# Patient Record
Sex: Female | Born: 1965 | Race: White | Hispanic: No | Marital: Single | Smoking: Former smoker
Health system: Southern US, Community
[De-identification: ages and names within clinical notes are randomized; demographics above are authoritative.]

## PROBLEM LIST (undated history)

## (undated) DIAGNOSIS — N912 Amenorrhea, unspecified: Secondary | ICD-10-CM

## (undated) DIAGNOSIS — H348112 Central retinal vein occlusion, right eye, stable: Secondary | ICD-10-CM

## (undated) DIAGNOSIS — F419 Anxiety disorder, unspecified: Secondary | ICD-10-CM

## (undated) DIAGNOSIS — E669 Obesity, unspecified: Secondary | ICD-10-CM

## (undated) DIAGNOSIS — E119 Type 2 diabetes mellitus without complications: Secondary | ICD-10-CM

## (undated) DIAGNOSIS — D329 Benign neoplasm of meninges, unspecified: Secondary | ICD-10-CM

## (undated) DIAGNOSIS — M5412 Radiculopathy, cervical region: Secondary | ICD-10-CM

## (undated) DIAGNOSIS — R569 Unspecified convulsions: Secondary | ICD-10-CM

## (undated) DIAGNOSIS — G4733 Obstructive sleep apnea (adult) (pediatric): Secondary | ICD-10-CM

## (undated) DIAGNOSIS — I10 Essential (primary) hypertension: Secondary | ICD-10-CM

## (undated) DIAGNOSIS — G4730 Sleep apnea, unspecified: Secondary | ICD-10-CM

## (undated) DIAGNOSIS — K219 Gastro-esophageal reflux disease without esophagitis: Secondary | ICD-10-CM

## (undated) DIAGNOSIS — Z87891 Personal history of nicotine dependence: Secondary | ICD-10-CM

## (undated) DIAGNOSIS — F32A Depression, unspecified: Secondary | ICD-10-CM

## (undated) DIAGNOSIS — R7303 Prediabetes: Secondary | ICD-10-CM

## (undated) HISTORY — DX: Depression, unspecified: F32.A

## (undated) HISTORY — DX: Unspecified convulsions: R56.9

## (undated) HISTORY — DX: Anxiety disorder, unspecified: F41.9

## (undated) HISTORY — DX: Gastro-esophageal reflux disease without esophagitis: K21.9

## (undated) HISTORY — DX: Sleep apnea, unspecified: G47.30

## (undated) HISTORY — DX: Benign neoplasm of meninges, unspecified: D32.9

## (undated) HISTORY — DX: Central retinal vein occlusion, right eye, stable: H34.8112

## (undated) HISTORY — DX: Obesity, unspecified: E66.9

## (undated) HISTORY — DX: Amenorrhea, unspecified: N91.2

## (undated) HISTORY — DX: Essential (primary) hypertension: I10

## (undated) HISTORY — DX: Prediabetes: R73.03

---

## 2004-07-25 ENCOUNTER — Ambulatory Visit: Payer: Self-pay | Admitting: Family Medicine

## 2004-09-04 ENCOUNTER — Ambulatory Visit: Payer: Self-pay | Admitting: Surgery

## 2005-12-13 ENCOUNTER — Ambulatory Visit: Payer: Self-pay | Admitting: Obstetrics and Gynecology

## 2005-12-31 ENCOUNTER — Ambulatory Visit: Payer: Self-pay | Admitting: Obstetrics and Gynecology

## 2006-08-14 ENCOUNTER — Ambulatory Visit: Payer: Self-pay | Admitting: Obstetrics and Gynecology

## 2007-01-06 ENCOUNTER — Ambulatory Visit: Payer: Self-pay | Admitting: Obstetrics and Gynecology

## 2008-01-08 ENCOUNTER — Ambulatory Visit: Payer: Self-pay | Admitting: Obstetrics and Gynecology

## 2009-01-11 ENCOUNTER — Ambulatory Visit: Payer: Self-pay | Admitting: Obstetrics and Gynecology

## 2010-01-17 ENCOUNTER — Ambulatory Visit: Payer: Self-pay | Admitting: Obstetrics and Gynecology

## 2011-01-23 ENCOUNTER — Ambulatory Visit: Payer: Self-pay | Admitting: Obstetrics and Gynecology

## 2012-04-17 ENCOUNTER — Ambulatory Visit: Payer: Self-pay | Admitting: Obstetrics and Gynecology

## 2013-04-21 ENCOUNTER — Ambulatory Visit: Payer: Self-pay | Admitting: Obstetrics and Gynecology

## 2014-01-15 DIAGNOSIS — E669 Obesity, unspecified: Secondary | ICD-10-CM | POA: Insufficient documentation

## 2014-01-15 DIAGNOSIS — I10 Essential (primary) hypertension: Secondary | ICD-10-CM | POA: Insufficient documentation

## 2014-04-26 ENCOUNTER — Ambulatory Visit: Payer: Self-pay | Admitting: Obstetrics and Gynecology

## 2015-02-28 ENCOUNTER — Other Ambulatory Visit: Payer: Self-pay | Admitting: Obstetrics and Gynecology

## 2015-02-28 DIAGNOSIS — Z1231 Encounter for screening mammogram for malignant neoplasm of breast: Secondary | ICD-10-CM

## 2015-04-28 ENCOUNTER — Ambulatory Visit
Admission: RE | Admit: 2015-04-28 | Discharge: 2015-04-28 | Disposition: A | Discharge status: Home / Self Care | Payer: BC Managed Care – PPO | Source: Ambulatory Visit | Attending: Obstetrics and Gynecology | Admitting: Obstetrics and Gynecology

## 2015-04-28 DIAGNOSIS — Z1231 Encounter for screening mammogram for malignant neoplasm of breast: Secondary | ICD-10-CM | POA: Diagnosis not present

## 2016-04-06 DIAGNOSIS — R7303 Prediabetes: Secondary | ICD-10-CM | POA: Insufficient documentation

## 2016-05-03 ENCOUNTER — Encounter: Payer: Self-pay | Admitting: Dietician

## 2016-05-03 ENCOUNTER — Encounter: Payer: BC Managed Care – PPO | Attending: Physician Assistant | Admitting: Dietician

## 2016-05-03 VITALS — Ht 66.0 in | Wt 274.8 lb

## 2016-05-03 DIAGNOSIS — R7303 Prediabetes: Secondary | ICD-10-CM | POA: Diagnosis not present

## 2016-05-03 NOTE — Progress Notes (Signed)
Medical Nutrition Therapy: Visit start time: [id number]  end time: 1715 Assessment:  Diagnosis: pre-diabetes (HgA1c of 5.8) Past medical history: hypertension, depression, obesity Psychosocial issues/ stress concerns: Patient rates her stress as moderate and indicates "ok" as to how well she is dealing with her stress.  Preferred learning method:  . Auditory . Visual  Current weight: 274.8 lbs  Height: 66 in Medications, supplements: see list Progress and evaluation:  Patient in for initial medical nutrition therapy assessment. She report she participated in an exercise "boot camp" a year ago and lost 8-10 lbs. She reported that the "diet" was too restrictive and the exercise too strenuous to continue. She regained the weight and her weight has remained stable in the past 9-10 months. She eats 3 meals per day and usually 2 snacks. Her lunch meals and dinner meals are frequently "take out" or dine "out" meals making it harder to control portions. She states that recently she has tried to be more mindful of the foods she selects.  Her main beverages are water and black coffee; no sweetened beverages. She likes vegetables but doesn't usually prepare at home because her 32 year old son doesn't eat them.  Her present diet is low in fruits, vegetables, fiber, whole grains and calcium sources. Physical activity: none  Dietary Intake:  Usual eating pattern includes 3 meals and 2 snacks per day. Dining out frequency: 11 meals per week.  Breakfast: oatmeal or cereal bar or 2 waffles with peanut butter or peanut butter toast with lite syrup. Snack: nabs or nuts or candy -whatever available. Lunch: 11:30 or 1:30pm- Has to be at "front desk" from 12-1:00pm Ex. Smoked [place] sub on sour dough bread, chips, water Snack: same as am Supper: 5:30to 7:00pm- [place] salad with grilled chicken or when cooks-Ex. Chicken and pasta Snack: usually no snack Beverages: water 6-7 cups per day, 3 cups of black  coffee  Nutrition Care Education: Pre-Diabetes: Discussed how positive diet and exercise changes can decrease risk of Type 2 diabetes.Instructed on a meal plan for pre-diabetes and to promote weight loss based on 1600 calories. Included carbohydrate counting and using food guide plate to instruct on balance of carbohydrate, protein and non-starchy vegetables. Discussed using the meal plan just as a guide to help her be more mindful of her food choices verses focusing on restriction. Encouraged to focus on foods that need to be added to her diet to meet basic nutritional needs. Gave and reviewed menu examples.  Nutritional Diagnosis:  [place]-3.3 Overweight/obesity As related to frequent dining out and lack of physical activity.  As evidenced by diet and exercise history..  Intervention:  Balance meals with lean protein, 2-4 servings of carbohydrate and "free vegetables". Keep frozen vegetables for a quick way to add to dinner meals. Plan meals- especially lunch and dinner. Take lunch more often to work. Limit added fats such as salad dressing, mayonnaise and margarines. Mix 1 flavored package of oatmeal with regular oatmeal or add light syrup to oatmeal. Pick up entrees for some evening meals and add fruit, salads or steamed vegetables as sides Education Materials given:  . Plate Planner . Food lists/ Planning A Balanced Meal . Sample meal pattern/ menus . Goals/ instructions Learner/ who was taught:  . Patient  Level of understanding: . Partial understanding; needs review/ practice VLearning barriers: . None Willingness to learn/ readiness for change: . Acceptance, ready for change  Monitoring and Evaluation:  Dietary intake, exercise, , and body weight  follow up: 05/29/16 at 9:00am

## 2016-05-03 NOTE — Patient Instructions (Addendum)
Balance meals with lean protein, 2-4 servings of carbohydrate and "free vegetables". Keep frozen vegetables for a quick way to add to dinner meals. Plan meals- especially lunch and dinner. Take lunch more often to work. Limit added fats such as salad dressing, mayonnaise and margarines. Mix 1 flavored package of oatmeal with regular oatmeal or add light syrup to oatmeal. Pick up entrees for some evening meals and add fruit, salads or steamed vegetables as sides.

## 2016-05-29 ENCOUNTER — Encounter: Payer: BC Managed Care – PPO | Attending: Physician Assistant | Admitting: Dietician

## 2016-05-29 VITALS — Ht 66.0 in | Wt 272.7 lb

## 2016-05-29 DIAGNOSIS — R7303 Prediabetes: Secondary | ICD-10-CM

## 2016-05-29 NOTE — Progress Notes (Signed)
Medical Nutrition Therapy Follow-up visit:  Time with patient:9:10-10:00am Visit #:2 ASSESSMENT:   Diagnosis:pre-diabetes (HgA1c of 5.8 )  Current weight:272.7     Height:66 in Medications: See list Progress and evaluation: Patient in for medical nutrition follow-up. She reports that she is planning meals more, especially her evening meals. She states she is reading labels for carbohydrate and has decreased in her diet. She states she has not decreased frequency of lunch meals eaten out, but is making lower fat choices.  She states she has not significantly increased fruits and vegetables in her diet but feels more ready to do so now. She has increased her intake of whole grains with oatmeal and ww bread but continues to have a low intake of fiber. She limits her alcohol intake to 1 day/week  when socializing with friends but drinks 6-7 servings on that day. She has lost 2.5 lbs since initial visit, 1 month ago.  She is not doing any type of physical activity. She does report that some of her stress has decreased since her friend is going to rent the house her mother lived in (next to patient's house) so plans to sell it have been put on hold.   NUTRITION CARE EDUCATION: Pre-diabetes:  Commended patient on positive diet changes she has made. Reviewed meal plan instructed on at previous visit as patient had questions regarding servings of carbohydrate vs. grams of carbohydrate. Stressed importance again of need for balance of adequate carbohydrate, protein, fat and non-starchy vegetables. Reviewed protein needs as patient may include 1-2 Premier shakes per day which adds 30-60 gms to her diet. Encouraged to decrease alcohol servings. Stressed again the role of exercise in controlling blood sugar as well as role in controlling weight. Patient states she is not ready to commit to structured exercise at this time.  INTERVENTION:  Include 2-4 servings of carbohydrate or 30 -60 gms of  carbohydrate per meal. Spread 10-11 servings of carbohydrate over 3 meals and 1-2 snacks. Continue to make lower fat choices when eating "out". Purchase more fruits and vegetables so can add to meals. Count 1 cup of any kind of casserole as 2 servings of carbohydrate. Protein- 60-65 gms/ day will meet basic protein needs.   EDUCATION MATERIALS GIVEN:  . Goals/ instructions LEARNER/ who was taught:  . Patient  LEVEL OF UNDERSTANDING: . Partial understanding; needs review/ practice LEARNING BARRIERS: . None WILLINGNESS TO LEARN/READINESS FOR CHANGE: . Eager, change in progress MONITORING AND EVALUATION:  Weight, diet, exercise  Follow-up 06/26/16 at 9:00am.

## 2016-05-29 NOTE — Patient Instructions (Addendum)
Include 2-4 servings of carbohydrate or 30 -60 gms of carbohydrate per meal. Spread 10-11 servings of carbohydrate over 3 meals and 1-2 snacks. Continue to make lower fat choices when eating "out". Purchase more fruits and vegetables so can add to meals. Count 1 cup of any kind of casserole as 2 servings of carbohydrate. Protein- 60-65 gms/ day will meet basic protein needs.

## 2016-06-26 ENCOUNTER — Encounter: Payer: Self-pay | Admitting: Dietician

## 2016-06-26 ENCOUNTER — Encounter: Payer: BC Managed Care – PPO | Attending: Physician Assistant | Admitting: Dietician

## 2016-06-26 VITALS — Ht 66.0 in | Wt 274.5 lb

## 2016-06-26 DIAGNOSIS — R7303 Prediabetes: Secondary | ICD-10-CM

## 2016-06-26 NOTE — Progress Notes (Signed)
Medical Nutrition Therapy Follow-up visit:  Time with patient: 9:05-9:45 Visit #:3 ASSESSMENT:  Diagnosis:pre-diabetes  Current weight:274.5 lbs    Height:66 in Medications: See list  Progress and evaluation: Patient in for medical nutrition therapy follow-up. She states that making positive diet choices has been "hit or mss" lately. She is not consistently planning her evening meals as she had previously done with success. She continues to be more aware of her food choices; for example she made soup this past weekend as a way to eat more vegetables. She also bought strawberries this week. She continues to eat most of her lunch meals "out" but tries to make lower fat choices on some days. She has tried to limit alcohol intake; for example had 1 mixed drink when out with a friend verses several. She has gained 1.8 lbs in the past month and was not surprised. She stated that this past weekend she baked cookies and ate 7-8 on Friday and Saturday. She has had no net gain in past 2 months since she initially lost 2.5 lbs after initial visit. She stated regarding her diet/nutrition, "I am doing all I can do right now".  She states she continues to feel overwhelmed especially regarding the need to sell her mother's house. The plan for a friend to move in has been delayed until Jan. 2018 which was a "set back" and discouraging for her.  Physical activity:no structured exercise; has a membership with [employer] but states she does not like to go to a gym.  NUTRITION CARE EDUCATION: Reviewed with patient the positive diet changes she has made and the fact that she is being more aware of her food choices. Discussed how over a 2 month period, her weight has been stable and questioned if more consistent exercise is a possibility and discussed options other than the gym. Again, encouraged her to continue to work on increasing foods that she likes that will help to meet basic nutrient needs and on  keeping a consistent meal pattern.  Offered for her to meet with [facility] counselor but she declined.   INTERVENTION:  Continue with mindful eating looking for ways to increase fruits, vegetables and calcium sources.Discussed options. Consider planning out evening meals again as this seemed to help without overwhelming. Walk on walking track near her home at least 2 days per week. Continue to limit alcohol.  EDUCATION MATERIALS GIVEN:  . Goals/ instructions  LEARNER/ who was taught:  . Patient   LEVEL OF UNDERSTANDING: Verbalizes understanding.  LEARNING BARRIERS: . None  WILLINGNESS TO LEARN/READINESS FOR CHANGE: . Hesitance, but making some changes.  MONITORING AND EVALUATION:  No follow-up scheduled. Patient was encouraged to call if desires further help with diet/nutrition.

## 2016-10-03 ENCOUNTER — Other Ambulatory Visit: Payer: Self-pay | Admitting: Physician Assistant

## 2016-10-03 DIAGNOSIS — Z1231 Encounter for screening mammogram for malignant neoplasm of breast: Secondary | ICD-10-CM

## 2016-10-31 ENCOUNTER — Ambulatory Visit
Admission: RE | Admit: 2016-10-31 | Discharge: 2016-10-31 | Disposition: A | Discharge status: Home / Self Care | Payer: BC Managed Care – PPO | Source: Ambulatory Visit | Attending: Physician Assistant | Admitting: Physician Assistant

## 2016-10-31 DIAGNOSIS — Z1231 Encounter for screening mammogram for malignant neoplasm of breast: Secondary | ICD-10-CM | POA: Insufficient documentation

## 2018-06-27 ENCOUNTER — Other Ambulatory Visit: Payer: Self-pay | Admitting: Physician Assistant

## 2018-06-27 ENCOUNTER — Other Ambulatory Visit: Payer: Self-pay | Admitting: Obstetrics and Gynecology

## 2018-06-27 DIAGNOSIS — Z1231 Encounter for screening mammogram for malignant neoplasm of breast: Secondary | ICD-10-CM

## 2018-07-17 ENCOUNTER — Ambulatory Visit
Admission: RE | Admit: 2018-07-17 | Discharge: 2018-07-17 | Disposition: A | Discharge status: Home / Self Care | Payer: BC Managed Care – PPO | Source: Ambulatory Visit | Attending: Physician Assistant | Admitting: Physician Assistant

## 2018-07-17 DIAGNOSIS — Z1231 Encounter for screening mammogram for malignant neoplasm of breast: Secondary | ICD-10-CM

## 2019-11-02 ENCOUNTER — Other Ambulatory Visit: Payer: Self-pay | Admitting: Physician Assistant

## 2019-11-02 DIAGNOSIS — Z1231 Encounter for screening mammogram for malignant neoplasm of breast: Secondary | ICD-10-CM

## 2019-12-21 ENCOUNTER — Other Ambulatory Visit: Payer: Self-pay

## 2019-12-21 ENCOUNTER — Ambulatory Visit: Payer: Self-pay

## 2019-12-21 DIAGNOSIS — L918 Other hypertrophic disorders of the skin: Secondary | ICD-10-CM

## 2019-12-21 DIAGNOSIS — L820 Inflamed seborrheic keratosis: Secondary | ICD-10-CM

## 2019-12-21 DIAGNOSIS — L814 Other melanin hyperpigmentation: Secondary | ICD-10-CM

## 2019-12-21 NOTE — Progress Notes (Signed)
Pt has no interest in treating her lentigo's or Sk's for cosmetic reasons. She will F/U with Dr. Nehemiah Massed for [phone] and skin tags. jj

## 2020-01-28 ENCOUNTER — Ambulatory Visit: Payer: BC Managed Care – PPO | Admitting: Dermatology

## 2020-11-09 ENCOUNTER — Other Ambulatory Visit: Payer: Self-pay | Admitting: Physician Assistant

## 2020-11-09 DIAGNOSIS — Z1231 Encounter for screening mammogram for malignant neoplasm of breast: Secondary | ICD-10-CM

## 2020-12-01 ENCOUNTER — Ambulatory Visit
Admission: RE | Admit: 2020-12-01 | Discharge: 2020-12-01 | Disposition: A | Discharge status: Home / Self Care | Payer: BC Managed Care – PPO | Source: Ambulatory Visit | Attending: Physician Assistant | Admitting: Physician Assistant

## 2020-12-01 ENCOUNTER — Other Ambulatory Visit: Payer: Self-pay

## 2020-12-01 DIAGNOSIS — Z1231 Encounter for screening mammogram for malignant neoplasm of breast: Secondary | ICD-10-CM | POA: Insufficient documentation

## 2021-12-19 ENCOUNTER — Other Ambulatory Visit: Payer: Self-pay | Admitting: Physician Assistant

## 2021-12-19 DIAGNOSIS — Z1231 Encounter for screening mammogram for malignant neoplasm of breast: Secondary | ICD-10-CM

## 2022-01-24 ENCOUNTER — Ambulatory Visit
Admission: RE | Admit: 2022-01-24 | Discharge: 2022-01-24 | Disposition: A | Discharge status: Home / Self Care | Payer: BC Managed Care – PPO | Source: Ambulatory Visit | Attending: Physician Assistant | Admitting: Physician Assistant

## 2022-01-24 DIAGNOSIS — Z1231 Encounter for screening mammogram for malignant neoplasm of breast: Secondary | ICD-10-CM | POA: Insufficient documentation

## 2022-02-01 ENCOUNTER — Other Ambulatory Visit: Payer: Self-pay | Admitting: Physician Assistant

## 2022-02-01 DIAGNOSIS — R928 Other abnormal and inconclusive findings on diagnostic imaging of breast: Secondary | ICD-10-CM

## 2022-02-01 DIAGNOSIS — N63 Unspecified lump in unspecified breast: Secondary | ICD-10-CM

## 2022-02-08 ENCOUNTER — Other Ambulatory Visit: Payer: BC Managed Care – PPO

## 2022-02-08 ENCOUNTER — Inpatient Hospital Stay: Admission: RE | Admit: 2022-02-08 | Payer: BC Managed Care – PPO | Source: Ambulatory Visit

## 2022-02-09 ENCOUNTER — Emergency Department: Payer: BC Managed Care – PPO

## 2022-02-09 ENCOUNTER — Other Ambulatory Visit: Payer: Self-pay

## 2022-02-09 ENCOUNTER — Emergency Department
Admission: EM | Admit: 2022-02-09 | Discharge: 2022-02-09 | Disposition: A | Discharge status: Nursing Home (Includes ICF) | Payer: BC Managed Care – PPO | Attending: Emergency Medicine | Admitting: Emergency Medicine

## 2022-02-09 DIAGNOSIS — R569 Unspecified convulsions: Secondary | ICD-10-CM | POA: Diagnosis present

## 2022-02-09 DIAGNOSIS — W06XXXA Fall from bed, initial encounter: Secondary | ICD-10-CM | POA: Diagnosis not present

## 2022-02-09 DIAGNOSIS — C719 Malignant neoplasm of brain, unspecified: Secondary | ICD-10-CM | POA: Diagnosis not present

## 2022-02-09 DIAGNOSIS — M6281 Muscle weakness (generalized): Secondary | ICD-10-CM | POA: Diagnosis not present

## 2022-02-09 DIAGNOSIS — R401 Stupor: Secondary | ICD-10-CM

## 2022-02-09 DIAGNOSIS — G40901 Epilepsy, unspecified, not intractable, with status epilepticus: Secondary | ICD-10-CM

## 2022-02-09 DIAGNOSIS — D496 Neoplasm of unspecified behavior of brain: Secondary | ICD-10-CM

## 2022-02-09 LAB — CBC WITH DIFFERENTIAL/PLATELET
Abs Immature Granulocytes: 0.08 10*3/uL — ABNORMAL HIGH (ref 0.00–0.07)
Basophils Absolute: 0 10*3/uL (ref 0.0–0.1)
Basophils Relative: 0 %
Eosinophils Absolute: 0 10*3/uL (ref 0.0–0.5)
Eosinophils Relative: 0 %
HCT: 41.2 % (ref 36.0–46.0)
Hemoglobin: 13.5 g/dL (ref 12.0–15.0)
Immature Granulocytes: 1 %
Lymphocytes Relative: 12 %
Lymphs Abs: 2.1 10*3/uL (ref 0.7–4.0)
MCH: 28.4 pg (ref 26.0–34.0)
MCHC: 32.8 g/dL (ref 30.0–36.0)
MCV: 86.6 fL (ref 80.0–100.0)
Monocytes Absolute: 1.3 10*3/uL — ABNORMAL HIGH (ref 0.1–1.0)
Monocytes Relative: 8 %
Neutro Abs: 14.1 10*3/uL — ABNORMAL HIGH (ref 1.7–7.7)
Neutrophils Relative %: 79 %
Platelets: 271 10*3/uL (ref 150–400)
RBC: 4.76 MIL/uL (ref 3.87–5.11)
RDW: 12.8 % (ref 11.5–15.5)
WBC: 17.6 10*3/uL — ABNORMAL HIGH (ref 4.0–10.5)
nRBC: 0 % (ref 0.0–0.2)

## 2022-02-09 LAB — PROTIME-INR
INR: 1.1 (ref 0.8–1.2)
Prothrombin Time: 14.5 seconds (ref 11.4–15.2)

## 2022-02-09 LAB — COMPREHENSIVE METABOLIC PANEL
ALT: 32 U/L (ref 0–44)
AST: 66 U/L — ABNORMAL HIGH (ref 15–41)
Albumin: 3.7 g/dL (ref 3.5–5.0)
Alkaline Phosphatase: 67 U/L (ref 38–126)
Anion gap: 13 (ref 5–15)
BUN: 19 mg/dL (ref 6–20)
CO2: 20 mmol/L — ABNORMAL LOW (ref 22–32)
Calcium: 9.1 mg/dL (ref 8.9–10.3)
Chloride: 106 mmol/L (ref 98–111)
Creatinine, Ser: 0.85 mg/dL (ref 0.44–1.00)
GFR, Estimated: >60 mL/min (ref >60)
Glucose, Bld: 182 mg/dL — ABNORMAL HIGH (ref 70–99)
Potassium: 3.8 mmol/L (ref 3.5–5.1)
Sodium: 139 mmol/L (ref 135–145)
Total Bilirubin: 1 mg/dL (ref 0.3–1.2)
Total Protein: 7.3 g/dL (ref 6.5–8.1)

## 2022-02-09 LAB — URINE DRUG SCREEN, QUALITATIVE (ARMC ONLY)
Amphetamines, Ur Screen: NOT DETECTED
Barbiturates, Ur Screen: NOT DETECTED
Benzodiazepine, Ur Scrn: POSITIVE — AB
Cannabinoid 50 Ng, Ur ~~LOC~~: NOT DETECTED
Cocaine Metabolite,Ur ~~LOC~~: NOT DETECTED
MDMA (Ecstasy)Ur Screen: NOT DETECTED
Methadone Scn, Ur: NOT DETECTED
Opiate, Ur Screen: NOT DETECTED
Phencyclidine (PCP) Ur S: NOT DETECTED
Tricyclic, Ur Screen: NOT DETECTED

## 2022-02-09 LAB — URINALYSIS, ROUTINE W REFLEX MICROSCOPIC
Bilirubin Urine: NEGATIVE
Glucose, UA: NEGATIVE mg/dL
Ketones, ur: NEGATIVE mg/dL
Leukocytes,Ua: NEGATIVE
Nitrite: NEGATIVE
Protein, ur: 100 mg/dL — AB
Specific Gravity, Urine: 1.027 (ref 1.005–1.030)
pH: 6 (ref 5.0–8.0)

## 2022-02-09 LAB — ETHANOL: Alcohol, Ethyl (B): <10 mg/dL (ref <10)

## 2022-02-09 LAB — ACETAMINOPHEN LEVEL: Acetaminophen (Tylenol), Serum: <10 ug/mL — ABNORMAL LOW (ref 10–30)

## 2022-02-09 LAB — SALICYLATE LEVEL: Salicylate Lvl: <7 mg/dL — ABNORMAL LOW (ref 7.0–30.0)

## 2022-02-09 MED ORDER — SODIUM CHLORIDE 0.9 % IV BOLUS
1000.0000 mL | Freq: Once | INTRAVENOUS | Status: AC
  Administered 2022-02-09: 1000 mL via INTRAVENOUS

## 2022-02-09 MED ORDER — LORAZEPAM 2 MG/ML IJ SOLN: 2.0000 mg | Freq: Once | INTRAMUSCULAR | Status: AC

## 2022-02-09 MED ORDER — LEVETIRACETAM IN NACL 1000 MG/100ML IV SOLN
1000.0000 mg | Freq: Once | INTRAVENOUS | Status: AC
  Administered 2022-02-09: 1000 mg via INTRAVENOUS
  Filled 2022-02-09: qty 100

## 2022-02-09 MED ORDER — GADOBUTROL 1 MMOL/ML IV SOLN: 10.0000 mL | Freq: Once | INTRAVENOUS | Status: DC | PRN

## 2022-02-09 MED ORDER — METHYLPREDNISOLONE SODIUM SUCC 125 MG IJ SOLR
125.0000 mg | INTRAMUSCULAR | Status: AC
  Administered 2022-02-09: 125 mg via INTRAVENOUS
  Filled 2022-02-09: qty 2

## 2022-02-09 MED ORDER — LORAZEPAM 2 MG/ML IJ SOLN
INTRAMUSCULAR | Status: AC
  Filled 2022-02-09: qty 1

## 2022-02-09 MED ORDER — LEVETIRACETAM IN NACL 1000 MG/100ML IV SOLN
1000.0000 mg | Freq: Once | INTRAVENOUS | Status: AC
  Administered 2022-02-09: 1000 mg via INTRAVENOUS
  Filled 2022-02-09: qty 200

## 2022-02-09 MED ORDER — SODIUM CHLORIDE 0.9 % IV SOLN: 2000.0000 mg | INTRAVENOUS | Status: DC

## 2022-02-09 MED ORDER — LORAZEPAM 2 MG/ML IJ SOLN
INTRAMUSCULAR | Status: AC
  Administered 2022-02-09: 2 mg via INTRAVENOUS
  Filled 2022-02-09: qty 1

## 2022-02-09 NOTE — ED Triage Notes (Signed)
BIB EMS from home. Called earlier for a fall. pt refused first EMS trasnport. Pt has no HX of seizures. EMS witnessed seizured. gave 2 mg of IN versed.  Pt oxygen dropped to 93% after versed. EMS placed on [place] at 2 liters. Alert to painful sstimuli. Pt in [place] for EMS.   22G IV R Hand 98 Axillary 211 BGL  89 HR  24 RR  119/74

## 2022-02-09 NOTE — ED Notes (Signed)
Pt transported to MRI on monitor with this RN.

## 2022-02-09 NOTE — ED Notes (Signed)
Called care link to request transport assistance to [facility] ED @ this time short staffed and extremely backed up with transports per [place] @ 1444

## 2022-02-09 NOTE — Procedures (Signed)
History: 56 year old female presenting with recurrent seizures with persistent left-sided weakness  Sedation: Ativan prior to study  Technique: This EEG was acquired with electrodes placed according to the International 10-20 electrode system (including Fp1, Fp2, F3, F4, C3, C4, P3, P4, O1, O2, T3, T4, T5, T6, A1, A2, Fz, Cz, Pz). The following electrodes were missing or displaced: none.   Background: There is an asymmetric background with attenuation of faster frequencies on the right compared to the left.  There is irregular slow activity throughout the right hemisphere.  There is a posterior dominant rhythm of 8 Hz that is seen on the left, but not on the right.  In addition, there are occasional right temporal sharp waves.   Photic stimulation: Physiologic driving is not performed  EEG Abnormalities: 1) right temporal sharp waves 2) right hemispheric slow activity 3) attenuation of posterior dominant rhythm on the right  Clinical Interpretation: This EEG demonstrates evidence of cerebral dysfunction of the right hemisphere as well as potential area of epileptogenicity in the right temporal region. There was no seizure recorded on this study.   Roland Rack, MD [facility] [phone]  If 7pm- 7am, please page neurology on call as listed in [place].

## 2022-02-09 NOTE — ED Notes (Signed)
All required documents reviewed by this Charge Rn are up to date and pt is ready for transport.

## 2022-02-09 NOTE — ED Notes (Signed)
Pt had witnessed seizure. Pt started twitched in the left side of the face. Full body shaking and urinated on herself. MD made aware. Neuro at bedside as well. Pt will go to CT.

## 2022-02-09 NOTE — Progress Notes (Signed)
Eeg done 

## 2022-02-09 NOTE — ED Notes (Signed)
[facility] transportation, [facility] contacted to set up transport for patient. Report called to Martinique Keck, Agricultural consultant at [facility] ED.

## 2022-02-09 NOTE — ED Notes (Signed)
Keppra out of stock in [place]. Message sent to pharmacy.

## 2022-02-09 NOTE — ED Provider Notes (Signed)
[facility] Provider Note    Event Date/Time   First MD Initiated Contact with Patient 02/09/22 1130     (approximate)   History   Chief Complaint: Seizures   HPI  Stephanie Francis is a 56 y.o. female with no known past medical history who comes ED by EMS due to seizures.  EMS reports the patient was called out to her home at 9:45 AM this morning due to falling out of bed.  At the time the patient refused EMS transport, but about an hour later at 1045 EMS was called to the home again with a reported seizure.  They report that the patient had a seizure that they witnessed as well which was generalized convulsive activity, lasting about 15 seconds, aborted by giving intranasal Versed.  Patient is not able to provide any further history at this time     Physical Exam   Triage Vital Signs: ED Triage Vitals  Enc Vitals Group     BP 02/09/22 1122 127/63     Pulse Rate 02/09/22 1122 90     Resp 02/09/22 1122 (!) 28     Temp --      Temp src --      SpO2 02/09/22 1122 98 %     Weight 02/09/22 1121 274 lb 7.6 oz (124.5 kg)     Height 02/09/22 1121 '5\' 6"'$  (1.676 m)     Head Circumference --      Peak Flow --      Pain Score 02/09/22 1120 0     Pain Loc --      Pain Edu? --      Excl. in [place]? --     Most recent vital signs: Vitals:   02/09/22 1300 02/09/22 1302  BP: (!) 99/59 102/66  Pulse: 87 87  Resp: (!) 26 (!) 22  SpO2: 97% 96%    General: Awake, not oriented CV:  Good peripheral perfusion.  Regular rate and rhythm Resp:  Normal effort.  Clear to auscultation bilaterally Abd:  No distention.  Soft and nontender Other:  PERRL, EOMI, cranial nerves III through XII intact.  Follows commands.  Exhibits weakness of the left upper extremity, moves all other extremities and withdraws to pain   ED Results / Procedures / Treatments   Labs (all labs ordered are listed, but only abnormal results are displayed) Labs Reviewed  COMPREHENSIVE METABOLIC  PANEL - Abnormal; Notable for the following components:      Result Value   CO2 20 (*)    Glucose, Bld 182 (*)    AST 66 (*)    All other components within normal limits  SALICYLATE LEVEL - Abnormal; Notable for the following components:   Salicylate Lvl <7.6 (*)    All other components within normal limits  ACETAMINOPHEN LEVEL - Abnormal; Notable for the following components:   Acetaminophen (Tylenol), Serum <10 (*)    All other components within normal limits  CBC WITH DIFFERENTIAL/PLATELET - Abnormal; Notable for the following components:   WBC 17.6 (*)    Neutro Abs 14.1 (*)    Monocytes Absolute 1.3 (*)    Abs Immature Granulocytes 0.08 (*)    All other components within normal limits  ETHANOL  PROTIME-INR  URINALYSIS, ROUTINE W REFLEX MICROSCOPIC  URINE DRUG SCREEN, QUALITATIVE ([facility] ONLY)  CBG MONITORING, ED     EKG    RADIOLOGY CT head viewed and interpreted by me, shows sizable mass in the right  frontal cortex.  Radiology report reviewed   PROCEDURES:  .Critical Care Performed by: Carrie Mew, MD Authorized by: Carrie Mew, MD   Critical care provider statement:    Critical care time (minutes):  35   Critical care time was exclusive of:  Separately billable procedures and treating other patients   Critical care was necessary to treat or prevent imminent or life-threatening deterioration of the following conditions:  CNS failure or compromise   Critical care was time spent personally by me on the following activities:  Development of treatment plan with patient or surrogate, discussions with consultants, evaluation of patient's response to treatment, examination of patient, obtaining history from patient or surrogate, ordering and performing treatments and interventions, ordering and review of laboratory studies, ordering and review of radiographic studies, pulse oximetry, re-evaluation of patient's condition and review of old charts Comments:         .1-3 Lead EKG Interpretation Performed by: Carrie Mew, MD Authorized by: Carrie Mew, MD     Interpretation: normal     ECG rate:  70   ECG rate assessment: normal     Rhythm: sinus rhythm     Ectopy: none     Conduction: normal     MEDICATIONS ORDERED IN ED: Medications  gadobutrol (GADAVIST) 1 MMOL/ML injection 10 mL (has no administration in time range)  LORazepam (ATIVAN) 2 MG/ML injection (  Given 02/09/22 1130)  sodium chloride 0.9 % bolus 1,000 mL (0 mLs Intravenous Stopped 02/09/22 1403)  methylPREDNISolone sodium succinate (SOLU-MEDROL) 125 mg/2 mL injection 125 mg (125 mg Intravenous Given 02/09/22 1205)  levETIRAcetam (KEPPRA) IVPB 1000 mg/100 mL premix (0 mg Intravenous Stopped 02/09/22 1221)    Followed by  levETIRAcetam (KEPPRA) IVPB 1000 mg/100 mL premix (0 mg Intravenous Stopped 02/09/22 1240)  LORazepam (ATIVAN) injection 2 mg (2 mg Intravenous Given 02/09/22 1357)  levETIRAcetam (KEPPRA) IVPB 1000 mg/100 mL premix (0 mg Intravenous Stopped 02/09/22 1339)     IMPRESSION / MDM / [place] / ED COURSE  I reviewed the triage vital signs and the nursing notes.                              Differential diagnosis includes, but is not limited to, intracranial hemorrhage, intracranial mass, ischemic stroke, electrolyte abnormality, primary seizure disorder, Todd's paralysis  Patient's presentation is most consistent with acute presentation with potential threat to life or bodily function.  *The patient is on the cardiac monitor to evaluate for evidence of arrhythmia and/or significant heart rate changes.**}  Patient presents with altered mental status and new onset of seizures.  Possible stroke but would not be a TNK candidate due to seizure activity.  She is maintaining her airway, vital signs are unremarkable.  Immediate CT scan was obtained which reviewed contemporaneously with scan acquisition, identifying a frontal cortex mass.  CT angiogram is  unlikely to be helpful due to patient having difficulty cooperating with exam and moving frequently, and the unenhanced scan reveals the cause of her symptoms so the angiogram portion of the study is canceled.  We will consult neurosurgery.   Clinical Course as of 02/09/22 1419  Fri Feb 09, 2022  1214 D/w NSGY who recommends AED, steroids, MRI w/wo. If pt does not return to baseline mental status, she'll need transfer to [facility] for advanced NSGY service and continuous EEG monitoring [PS]  1252 Pt still somnolent and restless, not moving left side.  Will need  to plan transfer to [facility] for admission [PS]    Clinical Course User Index [PS] Carrie Mew, MD    ----------------------------------------- 2:19 PM on 02/09/2022 ----------------------------------------- Accepted for transfer to [facility] as relayed from [place] transfer center to our ED secretary.   FINAL CLINICAL IMPRESSION(S) / ED DIAGNOSES   Final diagnoses:  Brain tumor ([place])  Seizure ([place])  Obtundation     Rx / DC Orders   ED Discharge Orders     None        Note:  This document was prepared using Dragon voice recognition software and may include unintentional dictation errors.   Carrie Mew, MD 02/09/22 1420

## 2022-02-09 NOTE — Consult Note (Addendum)
Neurology Consultation Reason for Consult: Seizure Referring Physician: Emilee Hero  CC: Seizure  History is obtained from: Patient, chart review  HPI: Stephanie Francis is a 56 y.o. female who was in her normal state of health yesterday evening.  This morning, she was seen to fall out of bed, EMS was called but she refused transport.  Subsequently, she was found to have seizure activity and she was transported emergently for such.  She received intranasal midazolam in transport.  She had recurrence of focal seizure activity after arrival and was given Ativan IV.  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   ROS: Unable to obtain due to altered mental status.   No past medical history on file.   Family History  Problem Relation Age of Onset   Breast cancer Neg Hx      Social History:  reports that she quit smoking about 24 years ago. She has never used smokeless tobacco. She reports current alcohol use of about 8.0 standard drinks per week. No history on file for drug use.   Exam: Current vital signs: BP 127/63   Pulse 90   Resp (!) 28   Ht '5\' 6"'$  (1.676 m)   Wt 124.5 kg   SpO2 98%   BMI 44.30 kg/m  Vital signs in last 24 hours: Pulse Rate:  [90] 90 (05/19 1122) Resp:  [28] 28 (05/19 1122) BP: (127)/(63) 127/63 (05/19 1122) SpO2:  [98 %] 98 % (05/19 1122) Weight:  [124.5 kg] 124.5 kg (05/19 1121)   Physical Exam  Constitutional: Appears well-developed and well-nourished.   Neuro: Mental Status: Patient is lethargic, but she does show teeth to command.  She does not move her left side to command. Cranial Nerves: II: She does not blink to threat from either direction. Pupils are equal, round, and reactive to light.   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  She presented with recurrent seizures without return to baseline consistent with focal status epilepticus.  This appears to have broken with Ativan, but she still has significant negative deficits.  Recommendations: 1) agree with steroids, antiepileptics. 2) would continue levetiracetam at 1 g twice daily 3) EEG to ensure she is no longer in focal status epilepticus. 4) if EEG is negative, but she does not return to baseline, may need continuous EEG monitoring 5) neurology will continue to follow while she is here.   Roland Rack, MD [facility] [phone]  If 7pm- 7am, please page neurology on call as listed in [place].

## 2022-02-26 ENCOUNTER — Other Ambulatory Visit: Payer: BC Managed Care – PPO

## 2022-03-22 ENCOUNTER — Ambulatory Visit
Admission: RE | Admit: 2022-03-22 | Discharge: 2022-03-22 | Disposition: A | Discharge status: Home / Self Care | Payer: BC Managed Care – PPO | Source: Ambulatory Visit | Attending: Physician Assistant | Admitting: Physician Assistant

## 2022-03-22 DIAGNOSIS — N63 Unspecified lump in unspecified breast: Secondary | ICD-10-CM | POA: Insufficient documentation

## 2022-03-22 DIAGNOSIS — R928 Other abnormal and inconclusive findings on diagnostic imaging of breast: Secondary | ICD-10-CM | POA: Diagnosis present

## 2022-11-15 ENCOUNTER — Other Ambulatory Visit: Payer: Self-pay | Admitting: Physician Assistant

## 2022-11-15 DIAGNOSIS — Z1231 Encounter for screening mammogram for malignant neoplasm of breast: Secondary | ICD-10-CM

## 2022-12-06 ENCOUNTER — Ambulatory Visit: Payer: BC Managed Care – PPO | Attending: Neurology

## 2022-12-06 DIAGNOSIS — M65322 Trigger finger, left index finger: Secondary | ICD-10-CM | POA: Diagnosis present

## 2022-12-06 DIAGNOSIS — M79645 Pain in left finger(s): Secondary | ICD-10-CM

## 2022-12-06 DIAGNOSIS — M6281 Muscle weakness (generalized): Secondary | ICD-10-CM | POA: Diagnosis present

## 2022-12-06 NOTE — Therapy (Signed)
OUTPATIENT OCCUPATIONAL THERAPY ORTHO EVALUATION  Patient Name: Stephanie Francis MRN: [id number] DOB:1966/04/27, 57 y.o., female Today's Date: 12/10/2022  PCP: Dr. Paulita Cradle REFERRING PROVIDER: Dr. Jennings Books  END OF SESSION:  OT End of Session - 12/10/22 1026     Visit Number 1    Number of Visits 12    Date for OT Re-Evaluation 01/17/23    Authorization Time Period Reporting period beginning 12/06/22    Progress Note Due on Visit 10    OT Start Time 1600    OT Stop Time 1704    OT Time Calculation (min) 64 min    Equipment Utilized During Treatment none    Activity Tolerance Patient tolerated treatment well    Behavior During Therapy [facility] for tasks assessed/performed             No past medical history on file. No past surgical history on file. There are no problems to display for this patient.   ONSET DATE: October 2023  REFERRING DIAG: Trigger Finger of L index finger  THERAPY DIAG:  Muscle weakness (generalized)  Trigger index finger of left hand  Pain in left finger(s)  Rationale for Evaluation and Treatment: Rehabilitation  SUBJECTIVE:  SUBJECTIVE STATEMENT: Pt reports doing well today. 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  Per chart/Dr. Manuella Ghazi: Left hand paresthesia in a patient with cervical radiculopathy, some component of carpal tunnel syndrome, weakness - concerning for central component, possible cubital tunnel syndrome component. - Negative ESR, C Reactive Protein, ANA, Rheumatoid Arthritis Factor - Recommend NCS upper extremity MRI C-Spine without contrast - we will do Occupational Therapy consult Left hand trigger finger - ortho consult  PRECAUTIONS: None  WEIGHT BEARING RESTRICTIONS: No  PAIN:  Are you having pain? 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 Manuella Ghazi June 13th   OBJECTIVE:   HAND DOMINANCE: Right   ADLs: Overall ADLs: indep but some pain in the L hand with gripping/pinching  FUNCTIONAL OUTCOME MEASURES: FOTO: 61; 69  UPPER EXTREMITY ROM:     Active ROM Right eval Left eval  Elbow flexion    Elbow extension    Wrist flexion 74 66  Wrist extension 70 71  Wrist ulnar deviation    Wrist radial deviation    Wrist pronation    Wrist supination    (Blank rows = not tested)  Active ROM Right eval Left eval  Thumb MCP (0-60)    Thumb IP (0-80)    Thumb Radial abd/add (0-55)     Thumb Palmar abd/add (0-45)     Thumb Opposition to Small Finger     Index MCP (0-90)  35   Index PIP (0-100)   95  Index DIP (0-70)   50  Long MCP (0-90)      Long PIP (0-100)      Long DIP (0-70)      Ring MCP (0-90)      Ring PIP (0-100)      Ring DIP (0-70)  Little MCP (0-90)      Little PIP (0-100)      Little DIP (0-70)      (Blank rows = not tested)    *L IF 4 cm from [facility]  HAND FUNCTION: Grip strength: Right: 47 lbs; Left: 23 lbs, Lateral pinch: Right: 17 lbs, Left: 13 lbs, 3 point pinch: Right: 17 lbs, Left: 13 lbs, and Tip pinch: Right 9 lbs, Left: 4 lbs  COORDINATION: 9 Hole Peg test: Right: 24 sec; Left: 24 sec  SENSATION: Pt reports L ulnar nerve distribution  for years; pt reports a neck injury 10+ years ago   EDEMA: Mild L volar palm  COGNITION: Overall cognitive status: Within functional limits for tasks assessed  OBSERVATIONS:  Unable to make composite fist on the L hand d/t stiffness in the IF.  Pain and weakness in the L hand limiting efficient use of the L hand with daily  tasks.    TODAY'S TREATMENT:                                                                                                                              DATE: 12/06/22 Evaluation completed.  Issued oval 8 splint for L IF and encouraged pt wear during the day, doffing intermittently for hand hygiene and stretching. Instructed pt to look for any redness on the finger that does not resolve after doffing splint; will provide larger splint if this occurs.  Size 9 appears to fit well and pt reports it feels comfortable.  Therapeutic Exercise: Initiated HEP.  Instructed pt in self passive stretching for the L hand digits, including digit extension, flexion of each digit, digit abd with good return demo.  Instructed pt in ice massage and contrast bath to perform before/after exercises to help with edema reduction and pain management.  Handouts issued.   PATIENT EDUCATION: Education details: HEP, ice massage, contrast bath, initiated joint protection/cumulative trauma prevention (avoid repetitive gripping), educ on trigger finger dx Person educated: Patient Education method: Explanation, Demonstration, and Handouts Education comprehension: verbalized understanding and needs further education  HOME EXERCISE PROGRAM: Ice massage, contrast bath, passive stretching for L hand digits (flex/ext/abd)  GOALS: Goals reviewed with patient? Yes   SHORT TERM GOALS: Target date: 12/27/22 (3 weeks)  Pt will be indep to perform HEP for improving L hand flexibility and strength for daily tasks. Baseline: Eval: initiated ROM exercises Goal status: INITIAL  2.  Pt will be indep to verbalize 2-3 joint protection/cumulative trauma prevention strategies to reduce pain/paresthesias in the L hand.  Baseline: Eval: initiated educ, further training needed Goal status: INITIAL  LONG TERM GOALS: Target date: 01/17/23 (6 weeks)  Pt will increase FOTO score to 69 or better to indicate improvement in self perceived  functional use of the L hand with daily tasks.  Baseline: Eval: 61 Goal status: INITIAL  2.  Pt will increase L grip strength by 10 or more lbs in order to improve ability  to hold and carry ADL supplies.   Baseline: L grip 23 lbs (R 47 lbs) Goal status: INITIAL  3.  Pt will increase L IF flexibility to enable pt to make a full composite fist to store ADL supplies without dropping. Baseline: Eval: Unable to make composite fist in L hand; L IF 4 cm from [facility] Goal status: INITIAL  4.  Pt will tolerate manual therapy, therapeutic modalities, exercises, and splinting to decrease pain in L hand to a reported 3/10 pain or less with activity.   Baseline: Eval: L hand pain 7/10 pain with activity (throbs) Goal status: INITIAL  ASSESSMENT:  CLINICAL IMPRESSION: Patient is a 57 y.o. female who was seen today for occupational therapy evaluation for trigger finger in L index finger.  Pt reports worsening pain and stiffness in the L hand over time, specifically since Oct of 2023.  Pt also with hx of parasthesias in the LUE ulnar nerve distribution.  L hand pain, weakness, and stiffness all contributing to decreased use and efficiency of the L hand during daily tasks.  Pt will benefit from skilled OT to address the above noted deficits in order to improve use of the L hand for daily tasks.  Pt in agreement with plan.  PERFORMANCE DEFICITS: in functional skills including ADLs, IADLs, coordination, dexterity, edema, ROM, strength, pain, flexibility, Fine motor control, decreased knowledge of precautions, decreased knowledge of use of DME, and UE functional use. IMPAIRMENTS: are limiting patient from ADLs, IADLs, work, and leisure.   COMORBIDITIES: has co-morbidities such as hx of tumor resection in a R sided meningioma, LUE parasthesias from possible cubital tunnel and cervical radiculopathy  that affects occupational performance. Patient will benefit from skilled OT to address above impairments and improve  overall function.  MODIFICATION OR ASSISTANCE TO COMPLETE EVALUATION: No modification of tasks or assist necessary to complete an evaluation.  OT OCCUPATIONAL PROFILE AND HISTORY: Problem focused assessment: Including review of records relating to presenting problem.  CLINICAL DECISION MAKING: Moderate - several treatment options, min-mod task modification necessary  REHAB POTENTIAL: Good  EVALUATION COMPLEXITY: Moderate      PLAN:  OT FREQUENCY: 1-2x/week  OT DURATION: 6 weeks  PLANNED INTERVENTIONS: self care/ADL training, therapeutic exercise, therapeutic activity, neuromuscular re-education, manual therapy, passive range of motion, splinting, ultrasound, iontophoresis, paraffin, moist heat, cryotherapy, contrast bath, patient/family education, and DME and/or AE instructions  RECOMMENDED OTHER SERVICES: None at this time  CONSULTED AND AGREED WITH PLAN OF CARE: Patient  PLAN FOR NEXT SESSION: see above  Leta Speller, MS, OTR/L  Darleene Cleaver, OT 12/10/2022, 12:18 PM

## 2022-12-10 ENCOUNTER — Other Ambulatory Visit: Payer: Self-pay | Admitting: Neurology

## 2022-12-10 DIAGNOSIS — R202 Paresthesia of skin: Secondary | ICD-10-CM

## 2022-12-10 LAB — COLOGUARD: COLOGUARD: POSITIVE — AB

## 2022-12-13 ENCOUNTER — Ambulatory Visit: Payer: BC Managed Care – PPO

## 2022-12-13 DIAGNOSIS — M6281 Muscle weakness (generalized): Secondary | ICD-10-CM

## 2022-12-13 DIAGNOSIS — M65322 Trigger finger, left index finger: Secondary | ICD-10-CM

## 2022-12-13 DIAGNOSIS — M79645 Pain in left finger(s): Secondary | ICD-10-CM

## 2022-12-13 NOTE — Therapy (Signed)
OUTPATIENT OCCUPATIONAL THERAPY ORTHO TREATMENT NOTE  Patient Name: Stephanie Francis MRN: [id number] DOB:January 19, 1966, 57 y.o., female Today's Date: 12/16/2022  PCP: Dr. Paulita Cradle REFERRING PROVIDER: Dr. Jennings Books  END OF SESSION:  OT End of Session - 12/16/22 1114     Visit Number 2    Number of Visits 12    Date for OT Re-Evaluation 01/17/23    Authorization Time Period Reporting period beginning 12/06/22    Progress Note Due on Visit 10    OT Start Time 1645    OT Stop Time 1730    OT Time Calculation (min) 45 min    Equipment Utilized During Treatment none    Activity Tolerance Patient tolerated treatment well    Behavior During Therapy [facility] for tasks assessed/performed              No past medical history on file. No past surgical history on file. There are no problems to display for this patient.   ONSET DATE: October 2023  REFERRING DIAG: Trigger Finger of L index finger  THERAPY DIAG:  Muscle weakness (generalized)  Trigger index finger of left hand  Pain in left finger(s)  Rationale for Evaluation and Treatment: Rehabilitation  SUBJECTIVE:  SUBJECTIVE STATEMENT: Pt saw Rachelle Hora for an ortho visit at [facility] yesterday.  Pt reports receiving a cortisone injection in her L hand for the trigger finger so her palm is still quite sore from the injection.  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  Per chart/Dr. Manuella Ghazi: Left hand paresthesia in a patient with cervical radiculopathy, some component of carpal tunnel syndrome, weakness - concerning for central component, possible cubital tunnel syndrome component. - Negative ESR, C Reactive Protein, ANA, Rheumatoid Arthritis Factor - Recommend NCS upper extremity MRI C-Spine without contrast - we will do Occupational Therapy consult Left hand trigger finger - ortho consult  PRECAUTIONS:  None  WEIGHT BEARING RESTRICTIONS: No  PAIN: no real pain just stiffness Are you having pain? 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 Manuella Ghazi June 13th   OBJECTIVE:   HAND DOMINANCE: Right   ADLs: Overall ADLs: indep but some pain in the L hand with gripping/pinching  FUNCTIONAL OUTCOME MEASURES: FOTO: 61; 69  UPPER EXTREMITY ROM:     Active ROM Right eval Left eval  Elbow flexion    Elbow extension    Wrist flexion 74 66  Wrist extension 70 71  Wrist ulnar deviation    Wrist radial deviation    Wrist pronation    Wrist supination    (Blank rows = not tested)  Active ROM Right eval Left eval  Thumb MCP (0-60)    Thumb IP (0-80)    Thumb Radial abd/add (0-55)     Thumb Palmar abd/add (0-45)     Thumb Opposition to Small Finger     Index MCP (0-90)  35   Index PIP (0-100)   95  Index DIP (0-70)   50  Long MCP (0-90)  Long PIP (0-100)      Long DIP (0-70)      Ring MCP (0-90)      Ring PIP (0-100)      Ring DIP (0-70)      Little MCP (0-90)      Little PIP (0-100)      Little DIP (0-70)      (Blank rows = not tested)    *L IF 4 cm from [facility]  HAND FUNCTION: Grip strength: Right: 47 lbs; Left: 23 lbs, Lateral pinch: Right: 17 lbs, Left: 13 lbs, 3 point pinch: Right: 17 lbs, Left: 13 lbs, and Tip pinch: Right 9 lbs, Left: 4 lbs  COORDINATION: 9 Hole Peg test: Right: 24 sec; Left: 24 sec  SENSATION: Pt reports L ulnar nerve distribution  for years; pt reports a neck injury 10+ years ago   EDEMA: Mild L volar palm  COGNITION: Overall cognitive status: Within  functional limits for tasks assessed  OBSERVATIONS:  Unable to make composite fist on the L hand d/t stiffness in the IF.  Pain and weakness in the L hand limiting efficient use of the L hand with daily tasks.    TODAY'S TREATMENT:                                                                                                                              Paraffin:  X10 min for L hand pain management/muscle relaxation in prep for therapeutic exercises.  Therapeutic Exercise: Reviewed self passive stretching for the L hand digits, including digit extension, flexion of each digit, and digit abd with good return demo.  Issued soft blue theraputty and instructed pt in strengthening and coordination exercises for L hand, including gross grasping, lateral/2 point/3 point pinching, digit abd/add, and digging coins out of putty.  Able to return demo with intermittent vc for technique to improve quality of movement.  Encouraged completion 5-10 min, 1-2x per day.  Advised on low reps of all to avoid triggering of L IF PIP.  Pt verbalized understanding.  Performed gentle passive digit flex/ext, and isolated MP, PIP, and DIP passive flex/ext stretching for the L IF.   Instructed pt in tendon gliding exercises for the L hand; completed 3 reps with mod tactile cues to promote correct positioning throughout.  Handout issued.  PATIENT EDUCATION: Education details: HEP, ice massage, contrast bath, initiated joint protection/cumulative trauma prevention (avoid repetitive gripping), educ on trigger finger dx Person educated: Patient Education method: Explanation, Demonstration, and Handouts Education comprehension: verbalized understanding and needs further education  HOME EXERCISE PROGRAM: Ice massage, contrast bath, passive stretching for L hand digits (flex/ext/abd), theraputty, tendon gliding  GOALS: Goals reviewed with patient? Yes   SHORT TERM GOALS: Target date: 12/27/22 (3 weeks)  Pt will be indep to  perform HEP for improving L hand flexibility and strength for daily tasks. Baseline: Eval: initiated ROM exercises Goal status: INITIAL  2.  Pt will be indep to verbalize  2-3 joint protection/cumulative trauma prevention strategies to reduce pain/paresthesias in the L hand.  Baseline: Eval: initiated educ, further training needed Goal status: INITIAL  LONG TERM GOALS: Target date: 01/17/23 (6 weeks)  Pt will increase FOTO score to 69 or better to indicate improvement in self perceived functional use of the L hand with daily tasks.  Baseline: Eval: 61 Goal status: INITIAL  2.  Pt will increase L grip strength by 10 or more lbs in order to improve ability to hold and carry ADL supplies.   Baseline: L grip 23 lbs (R 47 lbs) Goal status: INITIAL  3.  Pt will increase L IF flexibility to enable pt to make a full composite fist to store ADL supplies without dropping. Baseline: Eval: Unable to make composite fist in L hand; L IF 4 cm from [facility] Goal status: INITIAL  4.  Pt will tolerate manual therapy, therapeutic modalities, exercises, and splinting to decrease pain in L hand to a reported 3/10 pain or less with activity.   Baseline: Eval: L hand pain 7/10 pain with activity (throbs) Goal status: INITIAL  ASSESSMENT:  CLINICAL IMPRESSION: Pt saw Rachelle Hora for an ortho visit at [facility].  Pt reports receiving a cortisone injection in her L hand for the trigger finger so her palm is still quite sore from the injection.  Avoided STM today d/t tenderness in the palm from injection.  Pt found good pain relief from paraffin wax tx to L hand, and tolerated all therapeutic exercises well this date.  Pt reports good compliance with ice massage and stretching at home, and has tolerated oval 8 splint without issue.  Pt will benefit from continued skilled OT to improve pain, stiffness, and weakness in the L hand to better engage L hand for daily tasks.    PERFORMANCE DEFICITS: in  functional skills including ADLs, IADLs, coordination, dexterity, edema, ROM, strength, pain, flexibility, Fine motor control, decreased knowledge of precautions, decreased knowledge of use of DME, and UE functional use. IMPAIRMENTS: are limiting patient from ADLs, IADLs, work, and leisure.   COMORBIDITIES: has co-morbidities such as hx of tumor resection in a R sided meningioma, LUE parasthesias from possible cubital tunnel and cervical radiculopathy  that affects occupational performance. Patient will benefit from skilled OT to address above impairments and improve overall function.  MODIFICATION OR ASSISTANCE TO COMPLETE EVALUATION: No modification of tasks or assist necessary to complete an evaluation.  OT OCCUPATIONAL PROFILE AND HISTORY: Problem focused assessment: Including review of records relating to presenting problem.  CLINICAL DECISION MAKING: Moderate - several treatment options, min-mod task modification necessary  REHAB POTENTIAL: Good  EVALUATION COMPLEXITY: Moderate      PLAN:  OT FREQUENCY: 1-2x/week  OT DURATION: 6 weeks  PLANNED INTERVENTIONS: self care/ADL training, therapeutic exercise, therapeutic activity, neuromuscular re-education, manual therapy, passive range of motion, splinting, ultrasound, iontophoresis, paraffin, moist heat, cryotherapy, contrast bath, patient/family education, and DME and/or AE instructions  RECOMMENDED OTHER SERVICES: None at this time  CONSULTED AND AGREED WITH PLAN OF CARE: Patient  PLAN FOR NEXT SESSION: see above  Leta Speller, MS, OTR/L  Darleene Cleaver, OT 12/16/2022, 11:16 AM

## 2022-12-14 ENCOUNTER — Encounter: Payer: Self-pay | Admitting: Neurology

## 2022-12-24 ENCOUNTER — Ambulatory Visit: Payer: BC Managed Care – PPO | Attending: Neurology

## 2022-12-24 DIAGNOSIS — M6281 Muscle weakness (generalized): Secondary | ICD-10-CM | POA: Diagnosis present

## 2022-12-24 DIAGNOSIS — M65322 Trigger finger, left index finger: Secondary | ICD-10-CM

## 2022-12-24 DIAGNOSIS — M79645 Pain in left finger(s): Secondary | ICD-10-CM | POA: Diagnosis present

## 2022-12-24 DIAGNOSIS — R278 Other lack of coordination: Secondary | ICD-10-CM | POA: Insufficient documentation

## 2022-12-25 ENCOUNTER — Ambulatory Visit
Admission: RE | Admit: 2022-12-25 | Discharge: 2022-12-25 | Disposition: A | Discharge status: Home / Self Care | Payer: BC Managed Care – PPO | Source: Ambulatory Visit | Attending: Neurology | Admitting: Neurology

## 2022-12-25 DIAGNOSIS — R202 Paresthesia of skin: Secondary | ICD-10-CM

## 2022-12-25 NOTE — Therapy (Signed)
OUTPATIENT OCCUPATIONAL THERAPY ORTHO TREATMENT NOTE  Patient Name: Stephanie Francis MRN: [id number] DOB:Jan 19, 1966, 57 y.o., female Today's Date: 12/25/2022  PCP: Dr. Paulita Cradle REFERRING PROVIDER: Dr. Jennings Books  END OF SESSION:  OT End of Session - 12/25/22 1500     Visit Number 3    Number of Visits 12    Date for OT Re-Evaluation 01/17/23    Authorization Time Period Reporting period beginning 12/06/22    Progress Note Due on Visit 10    OT Start Time 1600    OT Stop Time 1645    OT Time Calculation (min) 45 min    Equipment Utilized During Treatment none    Activity Tolerance Patient tolerated treatment well    Behavior During Therapy [facility] for tasks assessed/performed            No past medical history on file. No past surgical history on file. There are no problems to display for this patient.   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  Per chart/Dr. Manuella Ghazi: Left hand paresthesia in a patient with cervical radiculopathy, some component of carpal tunnel syndrome, weakness - concerning for central component, possible cubital tunnel syndrome component. - Negative ESR, C Reactive Protein, ANA, Rheumatoid Arthritis Factor - Recommend NCS upper extremity MRI C-Spine without contrast - we will do Occupational Therapy consult Left hand trigger finger - ortho consult  PRECAUTIONS: None  WEIGHT BEARING RESTRICTIONS: No  PAIN: "No real pain, just stiffness." Are you having pain?  Yes: NPRS scale:  intermittently when throbbing 7/10 Pain location: base of L IF and PIP joint Pain description: throbbing, tender Aggravating factors: gripping/carrying heavy objects Relieving factors: rest/avoiding use of the L hand  LIVING ENVIRONMENT: Lives with: lives alone  PLOF: Independent, works as an Web designer for [employer]: "To avoid surgery and not to hurt."  NEXT MD VISIT: Return to Dr. Manuella Ghazi June 13th   OBJECTIVE:   HAND DOMINANCE: Right   ADLs: Overall ADLs: indep but some pain in the L hand with gripping/pinching  FUNCTIONAL OUTCOME MEASURES: FOTO: 61; 69  UPPER EXTREMITY ROM:     Active ROM Right eval Left eval  Elbow flexion    Elbow extension    Wrist flexion 74 66  Wrist extension 70 71  Wrist ulnar deviation    Wrist radial deviation    Wrist pronation    Wrist supination    (Blank rows = not tested)  Active ROM Right eval Left eval  Thumb MCP (0-60)    Thumb IP (0-80)    Thumb Radial abd/add (0-55)     Thumb Palmar abd/add (0-45)     Thumb Opposition to Small Finger     Index MCP (0-90)  35   Index PIP (0-100)   95  Index DIP (0-70)   50  Long MCP (0-90)      Long PIP (0-100)      Long DIP (0-70)      Ring MCP (0-90)  Ring PIP (0-100)      Ring DIP (0-70)      Little MCP (0-90)      Little PIP (0-100)      Little DIP (0-70)      (Blank rows = not tested)    *L IF 4 cm from [facility]  HAND FUNCTION: Grip strength: Right: 47 lbs; Left: 23 lbs, Lateral pinch: Right: 17 lbs, Left: 13 lbs, 3 point pinch: Right: 17 lbs, Left: 13 lbs, and Tip pinch: Right 9 lbs, Left: 4 lbs  COORDINATION: 9 Hole Peg test: Right: 24 sec; Left: 24 sec  SENSATION: 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  Pain and weakness in the L hand limiting efficient use of the L hand with daily tasks.    TODAY'S TREATMENT:                                                                                                                              Paraffin:  X10 min for L hand pain management/muscle relaxation in prep for therapeutic exercises.  Manual Therapy: Soft tissue massage performed at the base of the L IF MCP volar palm, and PIP joint of same digit, working to increase circulation, decrease pain, decrease swelling related to trigger finger in this digit.   Therapeutic Exercise: Performed passive stretching for the L hand digits, including digit extension, flexion of each digit, and digit abd with good return demo. Reinforced low reps for putty exercises at home to reduce risk of triggering in L IF PIP joint.  Pt verbalized understanding.  Reviewed tendon gliding exercises for the L hand with good return demo.  Performed A1 pulley stretching to increase cross-sectional area for improvement in triggering; with palm supinated and forearm resting on table, assisted pt to rest IF of MCP joint in 30 degrees flexion with rolled washcloth beneath finger.  OT blocked DIP for pt to perform isometric DIP flexion with full PIP ext for 3 sets 10 reps each.  Tolerated well.  PATIENT EDUCATION: Education details: HEP, ice massage, contrast bath, initiated joint protection/cumulative trauma prevention (avoid repetitive gripping), educ on trigger finger dx Person educated: Patient Education method: Explanation, Demonstration, and Handouts Education comprehension: verbalized understanding and needs further education  HOME EXERCISE PROGRAM: Ice massage, contrast bath, passive stretching for L hand digits (flex/ext/abd), theraputty, tendon gliding  GOALS: Goals reviewed with patient? Yes   SHORT TERM GOALS: Target date: 12/27/22 (3 weeks)  Pt will be indep to perform HEP for improving L hand  flexibility and strength for daily tasks. Baseline: Eval: initiated ROM exercises Goal status: INITIAL  2.  Pt will be indep to verbalize 2-3 joint protection/cumulative trauma prevention strategies to  reduce pain/paresthesias in the L hand.  Baseline: Eval: initiated educ, further training needed Goal status: INITIAL  LONG TERM GOALS: Target date: 01/17/23 (6 weeks)  Pt will increase FOTO score to 69 or better to indicate improvement in self perceived functional use of the L hand with daily tasks.  Baseline: Eval: 61 Goal status: INITIAL  2.  Pt will increase L grip strength by 10 or more lbs in order to improve ability to hold and carry ADL supplies.   Baseline: L grip 23 lbs (R 47 lbs) Goal status: INITIAL  3.  Pt will increase L IF flexibility to enable pt to make a full composite fist to store ADL supplies without dropping. Baseline: Eval: Unable to make composite fist in L hand; L IF 4 cm from [facility] Goal status: INITIAL  4.  Pt will tolerate manual therapy, therapeutic modalities, exercises, and splinting to decrease pain in L hand to a reported 3/10 pain or less with activity.   Baseline: Eval: L hand pain 7/10 pain with activity (throbs) Goal status: INITIAL  ASSESSMENT:  CLINICAL IMPRESSION: Pt reports little use of her oval 8 splint, as she states it makes her finger stiff.  OT reinforced benefits of use of her splint with frequent doffing for gentle passive stretching to combat the stiffness, but needing to perform flexion stretches with a hold instead of high repetitions in order to reduce risk of triggering.  Pt tolerated STM, paraffin, and therapeutic exercises well this date.  Pt reports overall she has no throbbing and less triggering thus far.  Pt reports she has not yet tried the contrast bath regimen, but has been doing an ice massage which she feels has helped her pain.  Pt is progressing well with her HEP.  Pt will benefit from continued skilled OT to improve pain,  stiffness, and weakness in the L hand to better engage L hand for daily tasks.    PERFORMANCE DEFICITS: in functional skills including ADLs, IADLs, coordination, dexterity, edema, ROM, strength, pain, flexibility, Fine motor control, decreased knowledge of precautions, decreased knowledge of use of DME, and UE functional use. IMPAIRMENTS: are limiting patient from ADLs, IADLs, work, and leisure.   COMORBIDITIES: has co-morbidities such as hx of tumor resection in a R sided meningioma, LUE parasthesias from possible cubital tunnel and cervical radiculopathy  that affects occupational performance. Patient will benefit from skilled OT to address above impairments and improve overall function.  MODIFICATION OR ASSISTANCE TO COMPLETE EVALUATION: No modification of tasks or assist necessary to complete an evaluation.  OT OCCUPATIONAL PROFILE AND HISTORY: Problem focused assessment: Including review of records relating to presenting problem.  CLINICAL DECISION MAKING: Moderate - several treatment options, min-mod task modification necessary  REHAB POTENTIAL: Good  EVALUATION COMPLEXITY: Moderate      PLAN:  OT FREQUENCY: 1-2x/week  OT DURATION: 6 weeks  PLANNED INTERVENTIONS: self care/ADL training, therapeutic exercise, therapeutic activity, neuromuscular re-education, manual therapy, passive range of motion, splinting, ultrasound, iontophoresis, paraffin, moist heat, cryotherapy, contrast bath, patient/family education, and DME and/or AE instructions  RECOMMENDED OTHER SERVICES: None at this time  CONSULTED AND AGREED WITH PLAN OF CARE: Patient  PLAN FOR NEXT SESSION: see above  Leta Speller, MS, OTR/L  Darleene Cleaver, OT 12/25/2022, 3:02 PM

## 2022-12-31 ENCOUNTER — Ambulatory Visit: Payer: BC Managed Care – PPO

## 2022-12-31 DIAGNOSIS — M79645 Pain in left finger(s): Secondary | ICD-10-CM

## 2022-12-31 DIAGNOSIS — M65322 Trigger finger, left index finger: Secondary | ICD-10-CM

## 2022-12-31 DIAGNOSIS — R278 Other lack of coordination: Secondary | ICD-10-CM

## 2022-12-31 DIAGNOSIS — M6281 Muscle weakness (generalized): Secondary | ICD-10-CM | POA: Diagnosis not present

## 2022-12-31 NOTE — Therapy (Signed)
OUTPATIENT OCCUPATIONAL THERAPY ORTHO TREATMENT NOTE  Patient Name: Stephanie Francis MRN: [id number] DOB:1966-06-04, 57 y.o., female Today's Date: 12/31/2022  PCP: Dr. Maurine MinisterMiriam McLaughlin REFERRING PROVIDER: Dr. Cristopher PeruHemang Shah  END OF SESSION:  OT End of Session - 12/31/22 1635     Visit Number 4    Number of Visits 12    Date for OT Re-Evaluation 01/17/23    Authorization Time Period Reporting period beginning 12/06/22    Progress Note Due on Visit 10    OT Start Time 1640    OT Stop Time 1725    OT Time Calculation (min) 45 min    Equipment Utilized During Treatment none    Activity Tolerance Patient tolerated treatment well    Behavior [facility] Therapy [facility] for tasks assessed/performed            History reviewed. No pertinent past medical history. History reviewed. No pertinent surgical history. There are no problems to display for this patient.   ONSET DATE: October 2023  REFERRING DIAG: Trigger Finger of L index finger  THERAPY DIAG:  Trigger index finger of left hand  Other lack of coordination  Pain in left finger(s)  Rationale for Evaluation and Treatment: Rehabilitation  SUBJECTIVE:  SUBJECTIVE STATEMENT: Pt reports the numbness in her 4th and 5th digits were worse this weekend so she did not use her putty.  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  Per chart/Dr. Sherryll BurgerShah: Left hand paresthesia in a patient with cervical radiculopathy, some component of carpal tunnel syndrome, weakness - concerning for central component, possible cubital tunnel syndrome component. - Negative ESR, C Reactive Protein, ANA, Rheumatoid Arthritis Factor - Recommend NCS upper extremity MRI C-Spine without contrast - we will do Occupational Therapy consult Left hand trigger finger - ortho consult  PRECAUTIONS: None  WEIGHT BEARING RESTRICTIONS: No  PAIN: "No real pain, just  stiffness." Are you having pain? Yes: NPRS scale:  intermittently when throbbing 7/10 Pain location: base of L IF and PIP joint Pain description: throbbing, tender Aggravating factors: gripping/carrying heavy objects Relieving factors: rest/avoiding use of the L hand  LIVING ENVIRONMENT: Lives with: lives alone  PLOF: Independent, works as an Environmental health practitioneradministrative assistant for [employer]   PATIENT GOALS: "To avoid surgery and not to hurt."  NEXT MD VISIT: Return to Dr. Sherryll BurgerShah June 13th   OBJECTIVE:   HAND DOMINANCE: Right   ADLs: Overall ADLs: indep but some pain in the L hand with gripping/pinching  FUNCTIONAL OUTCOME MEASURES: FOTO: 61; 69  UPPER EXTREMITY ROM:     Active ROM Right eval Left eval  Elbow flexion    Elbow extension    Wrist flexion 74 66  Wrist extension 70 71  Wrist ulnar deviation    Wrist radial deviation    Wrist pronation    Wrist supination    (Blank rows = not tested)  Active ROM Right eval Left eval  Thumb MCP (0-60)    Thumb IP (0-80)    Thumb Radial abd/add (0-55)     Thumb Palmar abd/add (0-45)     Thumb Opposition to Small Finger     Index MCP (0-90)  35   Index PIP (0-100)   95  Index DIP (0-70)   50  Long MCP (0-90)      Long PIP (0-100)      Long DIP (0-70)  Ring MCP (0-90)      Ring PIP (0-100)      Ring DIP (0-70)      Little MCP (0-90)      Little PIP (0-100)      Little DIP (0-70)      (Blank rows = not tested)    *L IF 4 cm from [facility]  HAND FUNCTION: Grip strength: Right: 47 lbs; Left: 23 lbs, Lateral pinch: Right: 17 lbs, Left: 13 lbs, 3 point pinch: Right: 17 lbs, Left: 13 lbs, and Tip pinch: Right 9 lbs, Left: 4 lbs  COORDINATION: 9 Hole Peg test: Right: 24 sec; Left: 24 sec  SENSATION: Pt reports L ulnar nerve distribution  for years; pt reports a neck injury 10+ years ago   EDEMA: Mild L volar palm  COGNITION: Overall cognitive status: Within functional limits for tasks assessed  OBSERVATIONS:  Unable to  make composite fist on the L hand d/t stiffness in the IF.  Pain and weakness in the L hand limiting efficient use of the L hand with daily tasks.    TODAY'S TREATMENT:                                                                                                                              Paraffin:  X10 min for L hand pain management/muscle relaxation in prep for therapeutic exercises.  Manual Therapy: Soft tissue massage performed at the base of the L IF MCP volar palm, and PIP joint of same digit, working to increase circulation, decrease pain, decrease swelling related to trigger finger in this digit.   Therapeutic Exercise: Performed passive stretching for the L hand digits, including digit extension and flexion of each digit. Performed A1 pulley stretching to increase cross-sectional area for improvement in triggering; with palm supinated and forearm resting on table, assisted pt to rest IF of MCP joint in 30 degrees flexion with rolled washcloth beneath finger.  OT blocked DIP for pt to perform isometric DIP flexion with full PIP ext for 3 sets 10 reps each. Reports improved pain.   PATIENT EDUCATION: Education details: HEP, ice massage, contrast bath, initiated joint protection/cumulative trauma prevention (avoid repetitive gripping), educ on trigger finger dx Person educated: Patient Education method: Explanation, Demonstration, and Handouts Education comprehension: verbalized understanding and needs further education  HOME EXERCISE PROGRAM: Ice massage, contrast bath, passive stretching for L hand digits (flex/ext/abd), theraputty, tendon gliding  GOALS: Goals reviewed with patient? Yes   SHORT TERM GOALS: Target date: 12/27/22 (3 weeks)  Pt will be indep to perform HEP for improving L hand flexibility and strength for daily tasks. Baseline: Eval: initiated ROM exercises Goal status: INITIAL  2.  Pt will be indep to verbalize 2-3 joint protection/cumulative trauma prevention  strategies to reduce pain/paresthesias in the L hand.  Baseline: Eval: initiated educ, further training needed Goal status: INITIAL  LONG TERM GOALS: Target date: 01/17/23 (6 weeks)  Pt will increase FOTO score to  69 or better to indicate improvement in self perceived functional use of the L hand with daily tasks.  Baseline: Eval: 61 Goal status: INITIAL  2.  Pt will increase L grip strength by 10 or more lbs in order to improve ability to hold and carry ADL supplies.   Baseline: L grip 23 lbs (R 47 lbs) Goal status: INITIAL  3.  Pt will increase L IF flexibility to enable pt to make a full composite fist to store ADL supplies without dropping. Baseline: Eval: Unable to make composite fist in L hand; L IF 4 cm from [facility] Goal status: INITIAL  4.  Pt will tolerate manual therapy, therapeutic modalities, exercises, and splinting to decrease pain in L hand to a reported 3/10 pain or less with activity.   Baseline: Eval: L hand pain 7/10 pain with activity (throbs) Goal status: INITIAL  ASSESSMENT:  CLINICAL IMPRESSION:   Pt tolerated STM, paraffin, and therapeutic exercises well this date. Pt reports increased elbow pain/numbness and has subsequently not performed HEP this week. Pt has continued ice massages for pain. Good tolerance of stretches, and massage this date with decreased pain noted. Pt will benefit from continued skilled OT to improve pain, stiffness, and weakness in the L hand to better engage L hand for daily tasks.    PERFORMANCE DEFICITS: in functional skills including ADLs, IADLs, coordination, dexterity, edema, ROM, strength, pain, flexibility, Fine motor control, decreased knowledge of precautions, decreased knowledge of use of DME, and UE functional use. IMPAIRMENTS: are limiting patient from ADLs, IADLs, work, and leisure.   COMORBIDITIES: has co-morbidities such as hx of tumor resection in a R sided meningioma, LUE parasthesias from possible cubital tunnel and cervical  radiculopathy  that affects occupational performance. Patient will benefit from skilled OT to address above impairments and improve overall function.  MODIFICATION OR ASSISTANCE TO COMPLETE EVALUATION: No modification of tasks or assist necessary to complete an evaluation.  OT OCCUPATIONAL PROFILE AND HISTORY: Problem focused assessment: Including review of records relating to presenting problem.  CLINICAL DECISION MAKING: Moderate - several treatment options, min-mod task modification necessary  REHAB POTENTIAL: Good  EVALUATION COMPLEXITY: Moderate      PLAN:  OT FREQUENCY: 1-2x/week  OT DURATION: 6 weeks  PLANNED INTERVENTIONS: self care/ADL training, therapeutic exercise, therapeutic activity, neuromuscular re-education, manual therapy, passive range of motion, splinting, ultrasound, iontophoresis, paraffin, moist heat, cryotherapy, contrast bath, patient/family education, and DME and/or AE instructions  RECOMMENDED OTHER SERVICES: None at this time  CONSULTED AND AGREED WITH PLAN OF CARE: Patient  PLAN FOR NEXT SESSION: see above   Kathie Dike, M.S. OTR/L  12/31/22, 4:41 PM  ascom [phone]   Presley Raddle, OT 12/31/2022, 4:36 PM

## 2023-01-07 ENCOUNTER — Ambulatory Visit: Payer: BC Managed Care – PPO

## 2023-01-07 DIAGNOSIS — M6281 Muscle weakness (generalized): Secondary | ICD-10-CM

## 2023-01-07 DIAGNOSIS — M79645 Pain in left finger(s): Secondary | ICD-10-CM

## 2023-01-07 DIAGNOSIS — M65322 Trigger finger, left index finger: Secondary | ICD-10-CM

## 2023-01-08 NOTE — Therapy (Signed)
OUTPATIENT OCCUPATIONAL THERAPY ORTHO TREATMENT NOTE  Patient Name: Stephanie Francis MRN: [id number] DOB:[birth year], 57 y.o., female Today's Date: 01/08/2023  PCP: Dr. Maurine Minister REFERRING PROVIDER: Dr. Cristopher Peru  END OF SESSION:  OT End of Session - 01/08/23 1359     Visit Number 5    Number of Visits 12    Date for OT Re-Evaluation 01/17/23    Authorization Time Period Reporting period beginning 12/06/22    Progress Note Due on Visit 10    OT Start Time 1645    OT Stop Time 1730    OT Time Calculation (min) 45 min    Equipment Utilized During Treatment none    Activity Tolerance Patient tolerated treatment well    Behavior During Therapy [facility] for tasks assessed/performed            No past medical history on file. No past surgical history on file. There are no problems to display for this patient.   ONSET DATE: October 2023  REFERRING DIAG: Trigger Finger of L index finger  THERAPY DIAG:  Muscle weakness (generalized)  Trigger index finger of left hand  Pain in left finger(s)  Rationale for Evaluation and Treatment: Rehabilitation  SUBJECTIVE:  SUBJECTIVE STATEMENT: Pt reports she had an MRI for the neck and will return to follow up with Dr. Sherryll Burger on May 2nd to review results.  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  Per chart/Dr. Sherryll Burger: Left hand paresthesia in a patient with cervical radiculopathy, some component of carpal tunnel syndrome, weakness - concerning for central component, possible cubital tunnel syndrome component. - Negative ESR, C Reactive Protein, ANA, Rheumatoid Arthritis Factor - Recommend NCS upper extremity MRI C-Spine without contrast - we will do Occupational Therapy consult Left hand trigger finger - ortho consult  PRECAUTIONS: None  WEIGHT BEARING  RESTRICTIONS: No  PAIN: "No real pain, just stiffness." Are you having pain? Yes: NPRS scale:  intermittently when throbbing 7/10 Pain location: base of L IF and PIP joint Pain description: throbbing, tender Aggravating factors: gripping/carrying heavy objects Relieving factors: rest/avoiding use of the L hand  LIVING ENVIRONMENT: Lives with: lives alone  PLOF: Independent, works as an Environmental health practitioner for [employer]   PATIENT GOALS: "To avoid surgery and not to hurt."  NEXT MD VISIT: Return to Dr. Sherryll Burger June 13th   OBJECTIVE:   HAND DOMINANCE: Right   ADLs: Overall ADLs: indep but some pain in the L hand with gripping/pinching  FUNCTIONAL OUTCOME MEASURES: FOTO: 61; 69  UPPER EXTREMITY ROM:     Active ROM Right eval Left eval  Elbow flexion    Elbow extension    Wrist flexion 74 66  Wrist extension 70 71  Wrist ulnar deviation    Wrist radial deviation    Wrist pronation    Wrist supination    (Blank rows = not tested)  Active ROM Right eval Left eval  Thumb MCP (0-60)    Thumb IP (0-80)    Thumb Radial abd/add (0-55)     Thumb Palmar abd/add (0-45)     Thumb Opposition to Small Finger     Index MCP (0-90)  35   Index PIP (0-100)   95  Index DIP (0-70)   50  Long MCP (0-90)      Long PIP (0-100)  Long DIP (0-70)      Ring MCP (0-90)      Ring PIP (0-100)      Ring DIP (0-70)      Little MCP (0-90)      Little PIP (0-100)      Little DIP (0-70)      (Blank rows = not tested)    *L IF 4 cm from [facility]  HAND FUNCTION: Grip strength: Right: 47 lbs; Left: 23 lbs, Lateral pinch: Right: 17 lbs, Left: 13 lbs, 3 point pinch: Right: 17 lbs, Left: 13 lbs, and Tip pinch: Right 9 lbs, Left: 4 lbs  COORDINATION: 9 Hole Peg test: Right: 24 sec; Left: 24 sec  SENSATION: 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  Pain and weakness in the L hand limiting efficient use of the L hand with daily tasks.    TODAY'S TREATMENT:                                                                                                                              Paraffin:  X10 min for L hand pain management/muscle relaxation in prep for therapeutic exercises.  Manual Therapy: Soft tissue massage performed at the base of the L IF MCP volar palm, and PIP joint of same digit, working to increase circulation, decrease pain, decrease swelling related to trigger finger in this digit.   Therapeutic Exercise: Performed passive stretching for the L hand digits, including digit extension and flexion of each digit. Performed A1 pulley stretching to increase cross-sectional area for improvement in triggering; with palm supinated and forearm resting on table, assisted pt to rest IF of MCP joint in 30 degrees flexion with rolled washcloth beneath finger.  OT blocked DIP for pt to perform isometric DIP flexion with full PIP ext for 3 sets 10 reps each. Reports improved pain.   PATIENT EDUCATION: Education details: Review of joint protection/cumulative trauma prevention (avoid repetitive gripping); positioning to reduce pain/swelling in L elbow while sleeping (rest arm on pillow/avoid extreme/prolonged flexion) Person educated: Patient Education method: explanation, vc, demo Education comprehension: verbalized understanding and needs further education  HOME EXERCISE PROGRAM: Ice massage, contrast bath, passive stretching for L hand digits (flex/ext/abd), theraputty, tendon gliding  GOALS: Goals reviewed with patient? Yes   SHORT TERM GOALS: Target date: 12/27/22 (3 weeks)  Pt will be indep to perform HEP for improving L hand flexibility and strength for daily tasks. Baseline: Eval: initiated ROM exercises Goal status: INITIAL  2.   Pt will be indep to verbalize 2-3 joint protection/cumulative trauma prevention strategies to reduce pain/paresthesias in the L hand.  Baseline: Eval: initiated educ, further training needed Goal status: INITIAL  LONG TERM  GOALS: Target date: 01/17/23 (6 weeks)  Pt will increase FOTO score to 69 or better to indicate improvement in self perceived functional use of the L hand with daily tasks.  Baseline: Eval: 61 Goal status: INITIAL  2.  Pt will increase L grip strength by 10 or more lbs in order to improve ability to hold and carry ADL supplies.   Baseline: L grip 23 lbs (R 47 lbs) Goal status: INITIAL  3.  Pt will increase L IF flexibility to enable pt to make a full composite fist to store ADL supplies without dropping. Baseline: Eval: Unable to make composite fist in L hand; L IF 4 cm from [facility] Goal status: INITIAL  4.  Pt will tolerate manual therapy, therapeutic modalities, exercises, and splinting to decrease pain in L hand to a reported 3/10 pain or less with activity.   Baseline: Eval: L hand pain 7/10 pain with activity (throbs) Goal status: INITIAL  ASSESSMENT:  CLINICAL IMPRESSION: Pt reports no pain in L IF this date and no recent triggering.  Pt reports that she's been avoiding activities that had previously caused triggering of the L IF.  Encouraged pt continue to monitor triggering with normal daily activities/normal use of L hand.  Pt reports little completion of hand exercises d/t increased irritation at L elbow over the last week.  Reinforced benefits of icing L elbow and avoiding any prolonged and extreme bending of the elbow, with recommendation to rest arm on pillow when sleeping.  Pt will have follow up with Dr. Sherryll Burger on May 2nd to review recent MRI for the neck.  Per chart, pt has stenosis and multiple cervical levels.  Encouraged pt to inquire about OT for the elbow or PT for the spine at that follow up visit with Dr. Sherryll Burger.  Pt tolerated STM, paraffin, and therapeutic  exercises well this date.  Flexor tendons in the palm continue to feel tight and will benefit from continued STM and passive stretching for increasing tissue extensibility.  Plan to assess L hand strength next session and possibly hold OT until after follow up visit with Dr. Sherryll Burger, depending on objective measures next session.  Pt in agreement with plan.      PERFORMANCE DEFICITS: in functional skills including ADLs, IADLs, coordination, dexterity, edema, ROM, strength, pain, flexibility, Fine motor control, decreased knowledge of precautions, decreased knowledge of use of DME, and UE functional use. IMPAIRMENTS: are limiting patient from ADLs, IADLs, work, and leisure.   COMORBIDITIES: has co-morbidities such as hx of tumor resection in a R sided meningioma, LUE parasthesias from possible cubital tunnel and cervical radiculopathy  that affects occupational performance. Patient will benefit from skilled OT to address above impairments and improve overall function.  MODIFICATION OR ASSISTANCE TO COMPLETE EVALUATION: No modification of tasks or assist necessary to complete an evaluation.  OT OCCUPATIONAL PROFILE AND HISTORY: Problem focused assessment: Including review of records relating to presenting problem.  CLINICAL DECISION MAKING: Moderate - several treatment options, min-mod task modification necessary  REHAB POTENTIAL: Good  EVALUATION COMPLEXITY: Moderate      PLAN:  OT FREQUENCY: 1-2x/week  OT DURATION: 6 weeks  PLANNED INTERVENTIONS: self care/ADL training, therapeutic exercise, therapeutic activity, neuromuscular re-education, manual therapy, passive range of motion, splinting, ultrasound, iontophoresis, paraffin, moist heat, cryotherapy, contrast bath, patient/family education, and DME and/or AE instructions  RECOMMENDED OTHER SERVICES: None at this time  CONSULTED AND AGREED WITH PLAN OF CARE: Patient  PLAN FOR NEXT SESSION: see above  Danelle Earthly, MS,  OTR/L  Otis Dials, OT 01/08/2023, 2:01 PM

## 2023-01-10 ENCOUNTER — Ambulatory Visit: Payer: BC Managed Care – PPO

## 2023-01-10 DIAGNOSIS — M65322 Trigger finger, left index finger: Secondary | ICD-10-CM

## 2023-01-10 DIAGNOSIS — M6281 Muscle weakness (generalized): Secondary | ICD-10-CM

## 2023-01-10 DIAGNOSIS — M79645 Pain in left finger(s): Secondary | ICD-10-CM

## 2023-01-10 NOTE — Therapy (Signed)
OUTPATIENT OCCUPATIONAL THERAPY ORTHO TREATMENT NOTE  Patient Name: Stephanie Francis MRN: [id number] DOB:10/10/1965, 57 y.o., female Today's Date: 01/12/2023  PCP: Dr. Maurine Minister REFERRING PROVIDER: Dr. Cristopher Peru  END OF SESSION:  OT End of Session - 01/12/23 1125     Visit Number 6    Number of Visits 12    Date for OT Re-Evaluation 01/17/23    Authorization Time Period Reporting period beginning 12/06/22    Progress Note Due on Visit 10    OT Start Time 1645    OT Stop Time 1730    OT Time Calculation (min) 45 min    Equipment Utilized During Treatment none    Activity Tolerance Patient tolerated treatment well    Behavior During Therapy [facility] for tasks assessed/performed            No past medical history on file. No past surgical history on file. There are no problems to display for this patient.   ONSET DATE: October 2023  REFERRING DIAG: Trigger Finger of L index finger  THERAPY DIAG:  Muscle weakness (generalized)  Trigger index finger of left hand  Pain in left finger(s)  Rationale for Evaluation and Treatment: Rehabilitation  SUBJECTIVE:  SUBJECTIVE STATEMENT: Pt reports doing well today and that she's looking forward to her beach trip with a friend next week.  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  Per chart/Dr. Sherryll Burger: Left hand paresthesia in a patient with cervical radiculopathy, some component of carpal tunnel syndrome, weakness - concerning for central component, possible cubital tunnel syndrome component. - Negative ESR, C Reactive Protein, ANA, Rheumatoid Arthritis Factor - Recommend NCS upper extremity MRI C-Spine without contrast - we will do Occupational Therapy consult Left hand trigger finger - ortho consult  PRECAUTIONS: None  WEIGHT BEARING RESTRICTIONS: No  PAIN: Pt reports 7/10 neck pain today but no pain in the L  hand Are you having pain? Yes: NPRS scale:  intermittently when throbbing 7/10 Pain location: base of L IF and PIP joint Pain description: throbbing, tender Aggravating factors: gripping/carrying heavy objects Relieving factors: rest/avoiding use of the L hand  LIVING ENVIRONMENT: Lives with: lives alone  PLOF: Independent, works as an Environmental health practitioner for [employer]   PATIENT GOALS: "To avoid surgery and not to hurt."  NEXT MD VISIT: Return to Dr. Sherryll Burger June 13th   OBJECTIVE:   HAND DOMINANCE: Right   ADLs: Overall ADLs: indep but some pain in the L hand with gripping/pinching  FUNCTIONAL OUTCOME MEASURES: FOTO: 61; 69  UPPER EXTREMITY ROM:     Active ROM Right eval Left eval  Elbow flexion    Elbow extension    Wrist flexion 74 66  Wrist extension 70 71  Wrist ulnar deviation    Wrist radial deviation    Wrist pronation    Wrist supination    (Blank rows = not tested)  Active ROM Right eval Left eval  Thumb MCP (0-60)    Thumb IP (0-80)    Thumb Radial abd/add (0-55)     Thumb Palmar abd/add (0-45)     Thumb Opposition to Small Finger     Index MCP (0-90)  35   Index PIP (0-100)   95  Index DIP (0-70)   50  Long MCP (0-90)      Long PIP (0-100)      Long DIP (0-70)  Ring MCP (0-90)      Ring PIP (0-100)      Ring DIP (0-70)      Little MCP (0-90)      Little PIP (0-100)      Little DIP (0-70)      (Blank rows = not tested)    *L IF 4 cm from [facility] 01/10/23: L IF 2 cm to [facility]  HAND FUNCTION: Grip strength: Right: 47 lbs; Left: 23 lbs, Lateral pinch: Right: 17 lbs, Left: 13 lbs, 3 point pinch: Right: 17 lbs, Left: 13 lbs, and Tip pinch: Right 9 lbs, Left: 4 lbs 01/10/23: L grip 38 lbs  COORDINATION: 9 Hole Peg test: Right: 24 sec; Left: 24 sec  SENSATION: 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  Pain and weakness in the L hand limiting efficient use of the L hand with daily tasks.    TODAY'S TREATMENT:                                                                                                                              Paraffin:  X10 min for L hand pain management/muscle relaxation in prep for therapeutic exercises.  Manual Therapy: Soft tissue massage performed at the base of the L IF MCP volar palm, and PIP joint of same digit, working to increase circulation, decrease pain, decrease swelling related to trigger finger in this digit.   Therapeutic Exercise: Performed passive stretching for the L hand digits, including digit extension and flexion of each digit. Performed A1 pulley stretching to increase cross-sectional area for improvement in triggering; with palm supinated and forearm resting on table, assisted pt to rest IF of MCP joint in 30 degrees flexion with rolled washcloth beneath finger.  OT blocked DIP for pt to perform isometric DIP flexion with full PIP ext for 3 sets 10 reps each. Reports improved pain.   PATIENT EDUCATION: Education details: Review of joint protection/cumulative trauma prevention (avoid repetitive gripping); positioning to reduce pain/swelling in L elbow while sleeping (rest arm on pillow/avoid extreme/prolonged flexion) Person educated: Patient Education method: explanation, vc, demo Education comprehension: verbalized understanding and needs further education  HOME EXERCISE PROGRAM: Ice massage, contrast bath, passive stretching for L hand digits (flex/ext/abd), theraputty, tendon gliding  GOALS: Goals reviewed with patient? Yes   SHORT TERM GOALS: Target date: 12/27/22 (3 weeks)  Pt will be indep to perform HEP for improving L hand flexibility and strength for daily tasks. Baseline: Eval: initiated ROM exercises Goal status: INITIAL  2.   Pt will be indep to verbalize 2-3 joint protection/cumulative trauma prevention strategies to reduce pain/paresthesias in the L hand.  Baseline: Eval: initiated educ, further training needed Goal status:  INITIAL  LONG TERM GOALS: Target date: 01/17/23 (6 weeks)  Pt will increase FOTO score to 69 or better to indicate improvement in self perceived functional use of the L hand with daily tasks.  Baseline: Eval: 61 Goal status: INITIAL  2.  Pt will increase L grip strength by 10 or more lbs in order to improve ability to hold and carry ADL supplies.   Baseline: L grip 23 lbs (R 47 lbs) Goal status: INITIAL  3.  Pt will increase L IF flexibility to enable pt to make a full composite fist to store ADL supplies without dropping. Baseline: Eval: Unable to make composite fist in L hand; L IF 4 cm from [facility] Goal status: INITIAL  4.  Pt will tolerate manual therapy, therapeutic modalities, exercises, and splinting to decrease pain in L hand to a reported 3/10 pain or less with activity.   Baseline: Eval: L hand pain 7/10 pain with activity (throbs) Goal status: INITIAL  ASSESSMENT: CLINICAL IMPRESSION: Pt reports no pain in L hand today and no recent triggering of L hand IF.  Pain only in cervical spine this date, which pt reports she has been managing with ice at home.  Cold pack for neck providing during tx session today during completion of hand activities.  Remeasured L grip and flexibility of composite fist.  Strength and flexibility continue to improve.  Now that L IF pain and triggering appears to be managed well, plan to have pt continue with her HEP at home and will put pt on hold for OT until after follow up visit with Dr. Sherryll Burger.  Pt will see Dr. Sherryll Burger on May 2 to review results of cervical MRI.  Pt plans to inquire about whether Dr. Sherryll Burger feels like OT may be beneficial to tx pt's L elbow pain or whether PT may be indicated to treat pt's cervical issues.  Pt in agreement with plan.  PERFORMANCE  DEFICITS: in functional skills including ADLs, IADLs, coordination, dexterity, edema, ROM, strength, pain, flexibility, Fine motor control, decreased knowledge of precautions, decreased knowledge of use of DME, and UE functional use. IMPAIRMENTS: are limiting patient from ADLs, IADLs, work, and leisure.   COMORBIDITIES: has co-morbidities such as hx of tumor resection in a R sided meningioma, LUE parasthesias from possible cubital tunnel and cervical radiculopathy  that affects occupational performance. Patient will benefit from skilled OT to address above impairments and improve overall function.  MODIFICATION OR ASSISTANCE TO COMPLETE EVALUATION: No modification of tasks or assist necessary to complete an evaluation.  OT OCCUPATIONAL PROFILE AND HISTORY: Problem focused assessment: Including review of records relating to presenting problem.  CLINICAL DECISION MAKING: Moderate - several treatment options, min-mod task modification necessary  REHAB POTENTIAL: Good  EVALUATION COMPLEXITY: Moderate      PLAN:  OT FREQUENCY: 1-2x/week  OT DURATION: 6 weeks  PLANNED INTERVENTIONS: self care/ADL training, therapeutic exercise, therapeutic activity, neuromuscular re-education, manual therapy, passive range of motion, splinting, ultrasound, iontophoresis, paraffin, moist heat, cryotherapy, contrast bath, patient/family education, and DME and/or AE instructions  RECOMMENDED OTHER SERVICES: None at this time  CONSULTED AND AGREED WITH PLAN OF CARE: Patient  PLAN FOR NEXT SESSION: see above  Danelle Earthly, MS, OTR/L  Otis Dials, OT 01/12/2023, 11:26 AM

## 2023-01-14 ENCOUNTER — Ambulatory Visit: Payer: BC Managed Care – PPO

## 2023-01-16 ENCOUNTER — Ambulatory Visit: Payer: BC Managed Care – PPO

## 2023-01-21 ENCOUNTER — Ambulatory Visit: Payer: BC Managed Care – PPO

## 2023-01-28 ENCOUNTER — Ambulatory Visit: Payer: BC Managed Care – PPO | Attending: Neurology

## 2023-01-28 DIAGNOSIS — R278 Other lack of coordination: Secondary | ICD-10-CM

## 2023-01-28 DIAGNOSIS — M65322 Trigger finger, left index finger: Secondary | ICD-10-CM | POA: Diagnosis present

## 2023-01-28 DIAGNOSIS — R202 Paresthesia of skin: Secondary | ICD-10-CM | POA: Diagnosis present

## 2023-01-28 DIAGNOSIS — M79645 Pain in left finger(s): Secondary | ICD-10-CM | POA: Diagnosis present

## 2023-01-28 DIAGNOSIS — M6281 Muscle weakness (generalized): Secondary | ICD-10-CM

## 2023-01-28 NOTE — Therapy (Unsigned)
OUTPATIENT OCCUPATIONAL THERAPY ORTHO TREATMENT NOTE  Patient Name: Stephanie Francis MRN: [id number] DOB:12-18-1965, 57 y.o., female Today's Date: 01/12/2023  PCP: Dr. Maurine Minister REFERRING PROVIDER: Dr. Cristopher Peru  END OF SESSION:  OT End of Session - 01/12/23 1125     Visit Number 6    Number of Visits 12    Date for OT Re-Evaluation 01/17/23    Authorization Time Period Reporting period beginning 12/06/22    Progress Note Due on Visit 10    OT Start Time 1645    OT Stop Time 1730    OT Time Calculation (min) 45 min    Equipment Utilized During Treatment none    Activity Tolerance Patient tolerated treatment well    Behavior During Therapy [facility] for tasks assessed/performed            No past medical history on file. No past surgical history on file. There are no problems to display for this patient.   ONSET DATE: October 2023  REFERRING DIAG: Trigger Finger of L index finger  THERAPY DIAG:  Muscle weakness (generalized)  Trigger index finger of left hand  Pain in left finger(s)  Rationale for Evaluation and Treatment: Rehabilitation  SUBJECTIVE:  SUBJECTIVE STATEMENT: Pt reports doing well today and that she's looking forward to her beach trip with a friend next week.  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  Per chart/Dr. Sherryll Burger: Left hand paresthesia in a patient with cervical radiculopathy, some component of carpal tunnel syndrome, weakness - concerning for central component, possible cubital tunnel syndrome component. - Negative ESR, C Reactive Protein, ANA, Rheumatoid Arthritis Factor - Recommend NCS upper extremity MRI C-Spine without contrast - we will do Occupational Therapy consult Left hand trigger finger - ortho consult  PRECAUTIONS: None  WEIGHT BEARING RESTRICTIONS: No  PAIN: Pt reports 3/10 L elbow, can reach 8/10 with over use  (lifting mulch bags recently) Are you having pain? Yes: NPRS scale:  intermittently when throbbing 7/10 Pain location: base of L IF and PIP joint Pain description: throbbing, tender Aggravating factors: gripping/carrying heavy objects Relieving factors: rest/avoiding use of the L hand  LIVING ENVIRONMENT: Lives with: lives alone  PLOF: Independent, works as an Environmental health practitioner for [employer]   PATIENT GOALS: "To avoid surgery and not to hurt."  NEXT MD VISIT: Return to Dr. Sherryll Burger June 13th   OBJECTIVE:   HAND DOMINANCE: Right   ADLs: Overall ADLs: indep but some pain in the L hand with gripping/pinching  FUNCTIONAL OUTCOME MEASURES: FOTO: 61; 69 01/28/23: 67  UPPER EXTREMITY ROM:     Active ROM Right eval Left eval Left  01/28/23  Elbow flexion     Elbow extension     Wrist flexion 74 66 70  Wrist extension 70 71 70  Wrist ulnar deviation     Wrist radial deviation     Wrist pronation     Wrist supination     (Blank rows = not tested)  Active ROM Right eval Left eval Left 01/28/23  Thumb MCP (0-60)     Thumb IP (0-80)     Thumb Radial abd/add (0-55)      Thumb Palmar abd/add (0-45)      Thumb Opposition to Small Finger      Index MCP (0-90)  35  60  Index PIP (0-100)   95 70 (achy)  Index DIP (  0-70)   50 50  Long MCP (0-90)       Long PIP (0-100)       Long DIP (0-70)       Ring MCP (0-90)       Ring PIP (0-100)       Ring DIP (0-70)       Little MCP (0-90)       Little PIP (0-100)       Little DIP (0-70)       (Blank rows = not tested)    *L IF 4 cm from [facility] 01/10/23: L IF 2 cm to [facility] 01/28/23: L IF 2 cm [facility]   HAND FUNCTION: Grip strength: Right: 47 lbs; Left: 23 lbs, Lateral pinch: Right: 17 lbs, Left: 13 lbs, 3 point pinch: Right: 17 lbs, Left: 13 lbs, and Tip pinch: Right 9 lbs, Left: 4 lbs 01/10/23: L grip 38 lbs 01/28/23: right 46 lbs, L 34 lbs; lateral pinch: 17 lbs, Left: 15 lbs,   COORDINATION: 9 Hole Peg test: Right: 24 sec; Left: 24  sec  SENSATION: Pt reports L ulnar nerve distribution  for years; pt reports a neck injury 10+ years ago  01/28/23: L arm numbness constantly concentrated L elbow and below on ulnar nerve side,  Frequently drops stuff d/t lack of sensation; difficulty putting an earring in d/t lack of sensation.   EDEMA: Mild L volar palm  COGNITION: Overall cognitive status: Within functional limits for tasks assessed  OBSERVATIONS:  Unable to make composite fist on the L hand d/t stiffness in the IF.  Pain and weakness in the L hand limiting efficient use of the L hand with daily tasks.    TODAY'S TREATMENT:                                                                                                                              Paraffin:  X10 min for L hand pain management/muscle relaxation in prep for therapeutic exercises.  Manual Therapy: Soft tissue massage performed at the base of the L IF MCP volar palm, and PIP joint of same digit, working to increase circulation, decrease pain, decrease swelling related to trigger finger in this digit.   Therapeutic Exercise: Performed passive stretching for the L hand digits, including digit extension and flexion of each digit. Performed A1 pulley stretching to increase cross-sectional area for improvement in triggering; with palm supinated and forearm resting on table, assisted pt to rest IF of MCP joint in 30 degrees flexion with rolled washcloth beneath finger.  OT blocked DIP for pt to perform isometric DIP flexion with full PIP ext for 3 sets 10 reps each. Reports improved pain.   PATIENT EDUCATION: Education details: Review of joint protection/cumulative trauma prevention (avoid repetitive gripping); positioning to reduce pain/swelling in L elbow while sleeping (rest arm on pillow/avoid extreme/prolonged flexion) Person educated: Patient Education method: explanation, vc, demo Education comprehension: verbalized understanding and needs further  education  HOME EXERCISE PROGRAM: Ice massage, contrast bath, passive stretching for L hand digits (flex/ext/abd), theraputty, tendon gliding  GOALS: Goals reviewed with patient? Yes   SHORT TERM GOALS: Target date: 12/27/22 (3 weeks)  Pt will be indep to perform HEP for improving L hand flexibility and strength for daily tasks. Baseline: Eval: initiated ROM exercises Goal status: INITIAL  2.  Pt will be indep to verbalize 2-3 joint protection/cumulative trauma prevention strategies to reduce pain/paresthesias in the L hand.  Baseline: Eval: initiated educ, further training needed Goal status: INITIAL  LONG TERM GOALS: Target date: 01/17/23 (6 weeks)  Pt will increase FOTO score to 69 or better to indicate improvement in self perceived functional use of the L hand with daily tasks.  Baseline: Eval: 61 Goal status: INITIAL  2.  Pt will increase L grip strength by 10 or more lbs in order to improve ability to hold and carry ADL supplies.   Baseline: L grip 23 lbs (R 47 lbs) Goal status: INITIAL  3.  Pt will increase L IF flexibility to enable pt to make a full composite fist to store ADL supplies without dropping. Baseline: Eval: Unable to make composite fist in L hand; L IF 4 cm from [facility] Goal status: INITIAL  4.  Pt will tolerate manual therapy, therapeutic modalities, exercises, and splinting to decrease pain in L hand to a reported 3/10 pain or less with activity.   Baseline: Eval: L hand pain 7/10 pain with activity (throbs) Goal status: INITIAL  ASSESSMENT: CLINICAL IMPRESSION: Pt reports no pain in L hand today and no recent triggering of L hand IF.  Pain only in cervical spine this date, which pt reports she has been managing with ice at home.  Cold pack for neck providing during tx session today during completion of hand activities.  Remeasured L grip and flexibility of composite fist.  Strength and flexibility continue to improve.  Now that L IF pain and triggering appears  to be managed well, plan to have pt continue with her HEP at home and will put pt on hold for OT until after follow up visit with Dr. Sherryll Burger.  Pt will see Dr. Sherryll Burger on May 2 to review results of cervical MRI.  Pt plans to inquire about whether Dr. Sherryll Burger feels like OT may be beneficial to tx pt's L elbow pain or whether PT may be indicated to treat pt's cervical issues.  Pt in agreement with plan.  PERFORMANCE DEFICITS: in functional skills including ADLs, IADLs, coordination, dexterity, edema, ROM, strength, pain, flexibility, Fine motor control, decreased knowledge of precautions, decreased knowledge of use of DME, and UE functional use. IMPAIRMENTS: are limiting patient from ADLs, IADLs, work, and leisure.   COMORBIDITIES: has co-morbidities such as hx of tumor resection in a R sided meningioma, LUE parasthesias from possible cubital tunnel and cervical radiculopathy  that affects occupational performance. Patient will benefit from skilled OT to address above impairments and improve overall function.  MODIFICATION OR ASSISTANCE TO COMPLETE EVALUATION: No modification of tasks or assist necessary to complete an evaluation.  OT OCCUPATIONAL PROFILE AND HISTORY: Problem focused assessment: Including review of records relating to presenting problem.  CLINICAL DECISION MAKING: Moderate - several treatment options, min-mod task modification necessary  REHAB POTENTIAL: Good  EVALUATION COMPLEXITY: Moderate      PLAN:  OT FREQUENCY: 1-2x/week  OT DURATION: 6 weeks  PLANNED INTERVENTIONS: self care/ADL training, therapeutic exercise, therapeutic activity, neuromuscular re-education, manual therapy, passive range of motion, splinting, ultrasound, iontophoresis,  paraffin, moist heat, cryotherapy, contrast bath, patient/family education, and DME and/or AE instructions  RECOMMENDED OTHER SERVICES: None at this time  CONSULTED AND AGREED WITH PLAN OF CARE: Patient  PLAN FOR NEXT SESSION: see  above  Danelle Earthly, MS, OTR/L  Otis Dials, OT 01/12/2023, 11:26 AM

## 2023-01-31 ENCOUNTER — Ambulatory Visit: Payer: BC Managed Care – PPO

## 2023-01-31 DIAGNOSIS — M6281 Muscle weakness (generalized): Secondary | ICD-10-CM

## 2023-01-31 DIAGNOSIS — R202 Paresthesia of skin: Secondary | ICD-10-CM

## 2023-01-31 DIAGNOSIS — M65322 Trigger finger, left index finger: Secondary | ICD-10-CM

## 2023-02-03 NOTE — Therapy (Signed)
OUTPATIENT OCCUPATIONAL THERAPY ORTHO TREATMENT NOTE  Patient Name: Stephanie Francis MRN: [id number] DOB:1965/10/05, 57 y.o., female Today's Date: 02/03/2023  PCP: Dr. Maurine Minister REFERRING PROVIDER: Dr. Cristopher Peru  END OF SESSION:  OT End of Session - 02/03/23 1303     Visit Number 8    Number of Visits 23    Date for OT Re-Evaluation 03/25/23    Authorization Time Period Reporting period beginning 12/06/22    Progress Note Due on Visit 10    OT Start Time 1645    OT Stop Time 1730    OT Time Calculation (min) 45 min    Equipment Utilized During Treatment none    Activity Tolerance Patient tolerated treatment well    Behavior During Therapy [facility] for tasks assessed/performed            No past medical history on file. No past surgical history on file. There are no problems to display for this patient.   ONSET DATE: October 2023  REFERRING DIAG: Trigger Finger of L index finger  THERAPY DIAG:  Muscle weakness (generalized)  Left hand paresthesia  Trigger index finger of left hand  Rationale for Evaluation and Treatment: Rehabilitation  SUBJECTIVE:  SUBJECTIVE STATEMENT: Pt reports L index finger is sore today, but not triggering. 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  Per chart/Dr. Sherryll Burger: Left hand paresthesia in a patient with cervical radiculopathy, some component of carpal tunnel syndrome, weakness - concerning for central component, possible cubital tunnel syndrome component. - Negative ESR, C Reactive Protein, ANA, Rheumatoid Arthritis Factor - Recommend NCS upper extremity MRI C-Spine without contrast - we will do Occupational Therapy consult Left hand trigger finger - ortho consult  PRECAUTIONS: None  WEIGHT BEARING RESTRICTIONS: No  PAIN: Pt reports 3/10 L elbow, can reach 8/10 with over use (lifting mulch bags recently), L IF 2-3/10  pain Are you having pain? Yes: NPRS scale:  intermittently when throbbing 7/10 Pain location: L elbow, base of L IF palmar aspect Pain description: throbbing, tender Aggravating factors: gripping/lifting/carrying heavy objects Relieving factors: rest/avoiding use of the L hand  LIVING ENVIRONMENT: Lives with: lives alone  PLOF: Independent, works as an Environmental health practitioner for [employer]   PATIENT GOALS: "To avoid surgery and not to hurt."  NEXT MD VISIT: Return to Dr. Sherryll Burger June 13th   OBJECTIVE:   HAND DOMINANCE: Right   ADLs: Overall ADLs: indep but some pain in the L hand with gripping/pinching  FUNCTIONAL OUTCOME MEASURES: FOTO: 61; 69 01/28/23: 67  UPPER EXTREMITY ROM:     Active ROM Right eval Left eval Right 01/28/23 Left  01/28/23  Elbow flexion   WNL WNL  Elbow extension   WNL WNL  Wrist flexion 74 66  70  Wrist extension 70 71  70  Wrist ulnar deviation      Wrist radial deviation      Wrist pronation   90 90  Wrist supination   64 70  (Blank rows = not tested)  Active ROM Right eval Left eval Right 01/28/23 Left 01/28/23  Elbow flexion   5 5  Elbow extension   5 5  Wrist flexion   5 4  Wrist extension   5 4  Thumb MCP (0-60)      Thumb IP (0-80)      Thumb Radial abd/add (0-55)  Thumb Palmar abd/add (0-45)       Thumb Opposition to Small Finger       Index MCP (0-90)  35   60  Index PIP (0-100)   95  70 (achy)  Index DIP (0-70)   50  50  Long MCP (0-90)        Long PIP (0-100)        Long DIP (0-70)        Ring MCP (0-90)        Ring PIP (0-100)        Ring DIP (0-70)        Little MCP (0-90)        Little PIP (0-100)        Little DIP (0-70)        (Blank rows = not tested)    *Eval:L IF 4 cm from [facility] 01/10/23: L IF 2 cm to [facility] 01/28/23: L IF 2 cm [facility]   HAND FUNCTION: Grip strength: Right: 47 lbs; Left: 23 lbs, Lateral pinch: Right: 17 lbs, Left: 13 lbs, 3 point pinch: Right: 17 lbs, Left: 13 lbs, and Tip pinch: Right 9 lbs, Left: 4  lbs 01/10/23: L grip 38 lbs 01/28/23: R grip 46 lbs, L grip 34 lbs; lateral pinch: 17 lbs, Left: 15 lbs,   COORDINATION: 9 Hole Peg test: Right: 24 sec; Left: 24 sec  SENSATION: Pt reports numbness in L ulnar nerve distribution  for years; pt reports a neck injury 10+ years ago  01/28/23: L arm numbness constantly; concentrated at L elbow and distally to the hand along ulnar nerve distribution.  Pt reports she frequently drops items in L hand d/t lack of sensation; difficulty putting an earring in d/t lack of sensation.    EDEMA: Mild L volar palm  COGNITION: Overall cognitive status: Within functional limits for tasks assessed  OBSERVATIONS:  Unable to make composite fist on the L hand d/t stiffness in the IF.  Pain and weakness in the L hand limiting efficient use of the L hand with daily tasks.    TODAY'S TREATMENT:                                                                                                                              Paraffin:  X10 min for L hand pain management/muscle relaxation in prep for therapeutic exercises.  Manual Therapy: Soft tissue massage performed at the base of the L IF MCP volar palm, and PIP joint of same digit, working to increase circulation, decrease pain, decrease swelling related to trigger finger pain in this digit.  Soft tissue massage completed at the L proximal dorsal elbow, working to increase circulation and decrease pain at and around cubital tunnel region.     Therapeutic Exercise: Performed passive stretching for the L hand IF, including digit extension and flexion of L IF MP, PIP and DIP for improving composite fisting. Instructed pt in ulnar nerve gliding  and stretching for BUEs.  Completed to each arm; handouts provided with good return demo with min vc and tactile cues.     PATIENT EDUCATION: Education details: Radiographer, therapeutic Person educated: Patient Education method: explanation, vc, demo, written handout Education  comprehension: min vc and tactile cues for return demo;   HOME EXERCISE PROGRAM: Ice massage, contrast bath, passive stretching for L hand digits (flex/ext/abd), theraputty, tendon gliding  GOALS: Goals reviewed with patient? Yes   SHORT TERM GOALS: Target date: 02/25/23 (4 weeks)  Pt will be indep to perform HEP for improving LUE flexibility and strength for daily tasks (revised on 01/28/23 to include elbow/wrist/hand). Baseline: Eval: initiated ROM exercises; 01/28/23: Pt reports inconsistent stretching over the last couple of weeks; inconsistent use of theraputty; will plan to add ulnar nerve glides Goal status: ongoing  2.  Pt will be indep to verbalize 2-3 joint protection/cumulative trauma prevention strategies to reduce pain/paresthesias in the L hand.  Baseline: Eval: initiated educ, further training needed; 01/28/23: indep with joint protection/cumulative trauma prevention strategies Goal status: achieved  LONG TERM GOALS: Target date: 03/25/23 (8 weeks)  Pt will increase FOTO score to 69 or better to indicate improvement in self perceived functional use of the L hand with daily tasks.  Baseline: Eval: 61; 01/28/23: 67 Goal status: achieved  2.  Pt will increase L grip strength by 15 or more lbs in order to improve ability to hold and carry ADL supplies (revised 01/28/23).   Baseline: L grip 23 lbs (R 47 lbs); 01/28/23: L grip 34 lbs (R 46 lbs) Goal status: achieved/revised/ongoing  3.  Pt will increase L IF flexibility to enable pt to make a full composite fist to store ADL supplies without dropping. Baseline: Eval: Unable to make composite fist in L hand; L IF 4 cm from [facility]; 01/28/23: L IF 2 cm from [facility] Goal status: ongoing  4.  Pt will tolerate manual therapy, therapeutic modalities, exercises, and splinting as needed to decrease pain in L hand and elbow to a reported 3/10 pain or less with activity.   Baseline: Eval: L hand pain 7/10 pain with activity (throbs); 01/28/23: 0/10 pain in L  hand at rest, tender 2-3/pain with touch (base of L IF, palmar aspect), 3/10 pain in L elbow at rest, can reach 8/10 with activity Goal status: revised 01/28/23 to include elbow   5. With use of compensatory strategies, pt will be able to independently and efficiently clasp an earring in her L ear.  Baseline: Pt reports that with the numbness from her L elbow to the hand, clasping an earring in L ear take extensive time and trials.  Goal status: New  ASSESSMENT: CLINICAL IMPRESSION: Pt reports L IF sore today, but not triggering.  Pt tolerated manual therapy well to L IF, palm and cubital tunnel region, working to decrease pain and swelling to these areas.  Iontophoresis order not yet obtained; anticipate being able to provide first iontophoresis tx next session at the cubital tunnel region next session if order is obtained by that time.  Added to HEP with ulnar nerve gliding bilaterally, demonstrating good tolerance and ability to follow written handout with min vc and tactile cues.  Will continue to review.  Pt will benefit from continued skilled OT to progress HEP, provide modalities for pain management including iontophoresis at the L elbow, and manual therapy for pain management in order to increase tolerance and functional use of the LUE with daily tasks, specifically being able to  manipulate small items and carry items in L hand without dropping.  Pt in agreement with poc.  PERFORMANCE DEFICITS: in functional skills including ADLs, IADLs, coordination, dexterity, edema, ROM, strength, pain, flexibility, Fine motor control, decreased knowledge of precautions, decreased knowledge of use of DME, and UE functional use. IMPAIRMENTS: are limiting patient from ADLs, IADLs, work, and leisure.   COMORBIDITIES: has co-morbidities such as hx of tumor resection in a R sided meningioma, LUE parasthesias from possible cubital tunnel and cervical radiculopathy  that affects occupational performance. Patient will  benefit from skilled OT to address above impairments and improve overall function.  MODIFICATION OR ASSISTANCE TO COMPLETE EVALUATION: No modification of tasks or assist necessary to complete an evaluation.  OT OCCUPATIONAL PROFILE AND HISTORY: Problem focused assessment: Including review of records relating to presenting problem.  CLINICAL DECISION MAKING: Moderate - several treatment options, min-mod task modification necessary  REHAB POTENTIAL: Good  EVALUATION COMPLEXITY: Moderate      PLAN:  OT FREQUENCY: 2x/week  OT DURATION: 8 weeks  PLANNED INTERVENTIONS: self care/ADL training, therapeutic exercise, therapeutic activity, neuromuscular re-education, manual therapy, passive range of motion, splinting, ultrasound, iontophoresis, paraffin, moist heat, cryotherapy, contrast bath, patient/family education, and DME and/or AE instructions  RECOMMENDED OTHER SERVICES: None at this time  CONSULTED AND AGREED WITH PLAN OF CARE: Patient  PLAN FOR NEXT SESSION: see above  Danelle Earthly, MS, OTR/L  Otis Dials, OT 02/03/2023, 1:08 PM

## 2023-02-04 ENCOUNTER — Ambulatory Visit: Payer: BC Managed Care – PPO

## 2023-02-04 DIAGNOSIS — M6281 Muscle weakness (generalized): Secondary | ICD-10-CM

## 2023-02-04 DIAGNOSIS — R202 Paresthesia of skin: Secondary | ICD-10-CM

## 2023-02-04 DIAGNOSIS — M65322 Trigger finger, left index finger: Secondary | ICD-10-CM

## 2023-02-05 NOTE — Therapy (Signed)
OUTPATIENT OCCUPATIONAL THERAPY ORTHO TREATMENT NOTE  Patient Name: Stephanie Francis MRN: [id number] DOB:[birth year], 57 y.o., female Today's Date: 02/05/2023  PCP: Dr. Maurine Minister REFERRING PROVIDER: Dr. Cristopher Peru  END OF SESSION:  OT End of Session - 02/05/23 0826     Visit Number 9    Number of Visits 23    Date for OT Re-Evaluation 03/25/23    Authorization Time Period Reporting period beginning 12/06/22    Progress Note Due on Visit 10    OT Start Time 1645    OT Stop Time 1730    OT Time Calculation (min) 45 min    Equipment Utilized During Treatment none    Activity Tolerance Patient tolerated treatment well    Behavior During Therapy [facility] for tasks assessed/performed            No past medical history on file. No past surgical history on file. There are no problems to display for this patient.   ONSET DATE: October 2023  REFERRING DIAG: Trigger Finger of L index finger  THERAPY DIAG:  Muscle weakness (generalized)  Left hand paresthesia  Trigger index finger of left hand  Rationale for Evaluation and Treatment: Rehabilitation  SUBJECTIVE:  SUBJECTIVE STATEMENT: Pt reports some soreness and stiffness in L palm today.   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  Per chart/Dr. Sherryll Burger: Left hand paresthesia in a patient with cervical radiculopathy, some component of carpal tunnel syndrome, weakness - concerning for central component, possible cubital tunnel syndrome component. - Negative ESR, C Reactive Protein, ANA, Rheumatoid Arthritis Factor - Recommend NCS upper extremity MRI C-Spine without contrast - we will do Occupational Therapy consult Left hand trigger finger - ortho consult  PRECAUTIONS: None  WEIGHT BEARING RESTRICTIONS: No  PAIN: 02/05/23: Pt reports 0/10 L elbow, can reach 8/10 with over use (lifting mulch bags recently), L IF  2-3/10 pain Are you having pain? Yes: NPRS scale:  intermittently when throbbing 7/10 Pain location: L elbow, base of L IF palmar aspect Pain description: throbbing, tender Aggravating factors: gripping/lifting/carrying heavy objects Relieving factors: rest/avoiding use of the L hand  LIVING ENVIRONMENT: Lives with: lives alone  PLOF: Independent, works as an Environmental health practitioner for [employer]   PATIENT GOALS: "To avoid surgery and not to hurt."  NEXT MD VISIT: Return to Dr. Sherryll Burger June 13th   OBJECTIVE:   HAND DOMINANCE: Right   ADLs: Overall ADLs: indep but some pain in the L hand with gripping/pinching  FUNCTIONAL OUTCOME MEASURES: FOTO: 61; 69 01/28/23: 67  UPPER EXTREMITY ROM:     Active ROM Right eval Left eval Right 01/28/23 Left  01/28/23  Elbow flexion   WNL WNL  Elbow extension   WNL WNL  Wrist flexion 74 66  70  Wrist extension 70 71  70  Wrist ulnar deviation      Wrist radial deviation      Wrist pronation   90 90  Wrist supination   64 70  (Blank rows = not tested)  Active ROM Right eval Left eval Right 01/28/23 Left 01/28/23  Elbow flexion   5 5  Elbow extension   5 5  Wrist flexion   5 4  Wrist extension   5 4  Thumb MCP (0-60)      Thumb IP (0-80)      Thumb Radial abd/add (0-55)  Thumb Palmar abd/add (0-45)       Thumb Opposition to Small Finger       Index MCP (0-90)  35   60  Index PIP (0-100)   95  70 (achy)  Index DIP (0-70)   50  50  Long MCP (0-90)        Long PIP (0-100)        Long DIP (0-70)        Ring MCP (0-90)        Ring PIP (0-100)        Ring DIP (0-70)        Little MCP (0-90)        Little PIP (0-100)        Little DIP (0-70)        (Blank rows = not tested)    *Eval:L IF 4 cm from [facility] 01/10/23: L IF 2 cm to [facility] 01/28/23: L IF 2 cm [facility]   HAND FUNCTION: Grip strength: Right: 47 lbs; Left: 23 lbs, Lateral pinch: Right: 17 lbs, Left: 13 lbs, 3 point pinch: Right: 17 lbs, Left: 13 lbs, and Tip pinch: Right 9 lbs, Left:  4 lbs 01/10/23: L grip 38 lbs 01/28/23: R grip 46 lbs, L grip 34 lbs; lateral pinch: 17 lbs, Left: 15 lbs,   COORDINATION: 9 Hole Peg test: Right: 24 sec; Left: 24 sec  SENSATION: Pt reports numbness in L ulnar nerve distribution  for years; pt reports a neck injury 10+ years ago  01/28/23: L arm numbness constantly; concentrated at L elbow and distally to the hand along ulnar nerve distribution.  Pt reports she frequently drops items in L hand d/t lack of sensation; difficulty putting an earring in d/t lack of sensation.    EDEMA: Mild L volar palm  COGNITION: Overall cognitive status: Within functional limits for tasks assessed  OBSERVATIONS:  Unable to make composite fist on the L hand d/t stiffness in the IF.  Pain and weakness in the L hand limiting efficient use of the L hand with daily tasks.    TODAY'S TREATMENT:                                                                                                                              Paraffin:  X10 min for L hand pain management/muscle relaxation in prep for therapeutic exercises.  Iontophoresis:  20 min tx to L cubital tunnel region; 2cc dexamethasone, 40 mA, 2.0 current; skin check performed following tx; intact/good tolerance.  Manual Therapy: Soft tissue massage performed at the base of the L IF MCP volar palm, and PIP joint of same digit, working to increase circulation, decrease pain, decrease swelling related to trigger finger pain in this digit.  Soft tissue massage completed at the L proximal dorsal elbow, working to increase circulation and decrease pain at and around cubital tunnel region.     Therapeutic Exercise: Performed passive stretching for the L  hand IF, including digit abd, digit extension and flexion of L IF MP, PIP and DIP for improving composite fisting.      PATIENT EDUCATION: Education details: ice for pain/edema L palm and cubital tunnel Person educated: Patient Education method: explanation,  vc Education comprehension: verbalized understanding  HOME EXERCISE PROGRAM: Ice massage, contrast bath, passive stretching for L hand digits (flex/ext/abd), theraputty, tendon gliding  GOALS: Goals reviewed with patient? Yes   SHORT TERM GOALS: Target date: 02/25/23 (4 weeks)  Pt will be indep to perform HEP for improving LUE flexibility and strength for daily tasks (revised on 01/28/23 to include elbow/wrist/hand). Baseline: Eval: initiated ROM exercises; 01/28/23: Pt reports inconsistent stretching over the last couple of weeks; inconsistent use of theraputty; will plan to add ulnar nerve glides Goal status: ongoing  2.  Pt will be indep to verbalize 2-3 joint protection/cumulative trauma prevention strategies to reduce pain/paresthesias in the L hand.  Baseline: Eval: initiated educ, further training needed; 01/28/23: indep with joint protection/cumulative trauma prevention strategies Goal status: achieved  LONG TERM GOALS: Target date: 03/25/23 (8 weeks)  Pt will increase FOTO score to 69 or better to indicate improvement in self perceived functional use of the L hand with daily tasks.  Baseline: Eval: 61; 01/28/23: 67 Goal status: achieved  2.  Pt will increase L grip strength by 15 or more lbs in order to improve ability to hold and carry ADL supplies (revised 01/28/23).   Baseline: L grip 23 lbs (R 47 lbs); 01/28/23: L grip 34 lbs (R 46 lbs) Goal status: achieved/revised/ongoing  3.  Pt will increase L IF flexibility to enable pt to make a full composite fist to store ADL supplies without dropping. Baseline: Eval: Unable to make composite fist in L hand; L IF 4 cm from [facility]; 01/28/23: L IF 2 cm from [facility] Goal status: ongoing  4.  Pt will tolerate manual therapy, therapeutic modalities, exercises, and splinting as needed to decrease pain in L hand and elbow to a reported 3/10 pain or less with activity.   Baseline: Eval: L hand pain 7/10 pain with activity (throbs); 01/28/23: 0/10 pain in L hand  at rest, tender 2-3/pain with touch (base of L IF, palmar aspect), 3/10 pain in L elbow at rest, can reach 8/10 with activity Goal status: revised 01/28/23 to include elbow   5. With use of compensatory strategies, pt will be able to independently and efficiently clasp an earring in her L ear.  Baseline: Pt reports that with the numbness from her L elbow to the hand, clasping an earring in L ear take extensive time and trials.  Goal status: New  ASSESSMENT: CLINICAL IMPRESSION: Pt reports some soreness and stiffness in L palm today.  Good tolerance to passive stretching and soft tissue massage today.  Pt tolerated first tx of iontophoresis to L cubital tunnel region well today.  Pt will benefit from continued skilled OT to progress HEP, provide modalities for pain management including iontophoresis at the L elbow, and manual therapy for pain management in order to increase tolerance and functional use of the LUE with daily tasks, specifically being able to manipulate small items and carry items in L hand without dropping.    PERFORMANCE DEFICITS: in functional skills including ADLs, IADLs, coordination, dexterity, edema, ROM, strength, pain, flexibility, Fine motor control, decreased knowledge of precautions, decreased knowledge of use of DME, and UE functional use. IMPAIRMENTS: are limiting patient from ADLs, IADLs, work, and leisure.   COMORBIDITIES: has co-morbidities  such as hx of tumor resection in a R sided meningioma, LUE parasthesias from possible cubital tunnel and cervical radiculopathy  that affects occupational performance. Patient will benefit from skilled OT to address above impairments and improve overall function.  MODIFICATION OR ASSISTANCE TO COMPLETE EVALUATION: No modification of tasks or assist necessary to complete an evaluation.  OT OCCUPATIONAL PROFILE AND HISTORY: Problem focused assessment: Including review of records relating to presenting problem.  CLINICAL DECISION  MAKING: Moderate - several treatment options, min-mod task modification necessary  REHAB POTENTIAL: Good  EVALUATION COMPLEXITY: Moderate      PLAN:  OT FREQUENCY: 2x/week  OT DURATION: 8 weeks  PLANNED INTERVENTIONS: self care/ADL training, therapeutic exercise, therapeutic activity, neuromuscular re-education, manual therapy, passive range of motion, splinting, ultrasound, iontophoresis, paraffin, moist heat, cryotherapy, contrast bath, patient/family education, and DME and/or AE instructions  RECOMMENDED OTHER SERVICES: None at this time  CONSULTED AND AGREED WITH PLAN OF CARE: Patient  PLAN FOR NEXT SESSION: see above  Danelle Earthly, MS, OTR/L  Otis Dials, OT 02/05/2023, 8:31 AM

## 2023-02-07 ENCOUNTER — Ambulatory Visit: Payer: BC Managed Care – PPO

## 2023-02-07 DIAGNOSIS — R202 Paresthesia of skin: Secondary | ICD-10-CM

## 2023-02-07 DIAGNOSIS — M79645 Pain in left finger(s): Secondary | ICD-10-CM

## 2023-02-07 DIAGNOSIS — M6281 Muscle weakness (generalized): Secondary | ICD-10-CM

## 2023-02-10 NOTE — Therapy (Signed)
OUTPATIENT OCCUPATIONAL THERAPY ORTHO PROGRESS AND TREATMENT NOTE Reporting period beginning 12/06/22-02/07/23   Patient Name: Stephanie Francis MRN: [id number] DOB:September 11, 1966, 57 y.o., female Today's Date: 02/10/2023  PCP: Dr. Maurine Minister REFERRING PROVIDER: Dr. Cristopher Peru  END OF SESSION:  OT End of Session - 02/10/23 1408     Visit Number 10    Number of Visits 23    Date for OT Re-Evaluation 03/25/23    Authorization Time Period Reporting period beginning 12/06/22-02/07/23    Progress Note Due on Visit 10    OT Start Time 1645    OT Stop Time 1730    OT Time Calculation (min) 45 min    Equipment Utilized During Treatment none    Activity Tolerance Patient tolerated treatment well    Behavior During Therapy [facility] for tasks assessed/performed            No past medical history on file. No past surgical history on file. There are no problems to display for this patient.  ONSET DATE: October 2023  REFERRING DIAG: Trigger Finger of L index finger  THERAPY DIAG:  Left hand paresthesia  Pain in left finger(s)  Muscle weakness (generalized)  Rationale for Evaluation and Treatment: Rehabilitation  SUBJECTIVE:  SUBJECTIVE STATEMENT: Pt reports L elbow doesn't seem to be as flared up this week. 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  Per chart/Dr. Sherryll Burger: Left hand paresthesia in a patient with cervical radiculopathy, some component of carpal tunnel syndrome, weakness - concerning for central component, possible cubital tunnel syndrome component. - Negative ESR, C Reactive Protein, ANA, Rheumatoid Arthritis Factor - Recommend NCS upper extremity MRI C-Spine without contrast - we will do Occupational Therapy consult Left hand trigger finger - ortho consult  PRECAUTIONS: None  WEIGHT BEARING RESTRICTIONS: No  PAIN: 02/07/23: Pt reports 0/10 L elbow, L hand  1-2/10 pain Are you having pain? Yes: NPRS scale:  intermittently when throbbing 7/10 Pain location: L elbow, base of L IF palmar aspect Pain description: throbbing, tender Aggravating factors: gripping/lifting/carrying heavy objects Relieving factors: rest/avoiding use of the L hand  LIVING ENVIRONMENT: Lives with: lives alone  PLOF: Independent, works as an Environmental health practitioner for [employer]   PATIENT GOALS: "To avoid surgery and not to hurt."  NEXT MD VISIT: Return to Dr. Sherryll Burger June 13th   OBJECTIVE:   HAND DOMINANCE: Right   ADLs: Overall ADLs: indep but some pain in the L hand with gripping/pinching  FUNCTIONAL OUTCOME MEASURES: FOTO: 61; 69 01/28/23: 67 02/07/23: 67  UPPER EXTREMITY ROM:     Active ROM Right eval Left eval Right 01/28/23 Left  01/28/23  Elbow flexion   WNL WNL  Elbow extension   WNL WNL  Wrist flexion 74 66  70  Wrist extension 70 71  70  Wrist ulnar deviation      Wrist radial deviation      Wrist pronation   90 90  Wrist supination   64 70  (Blank rows = not tested)  Active ROM Right eval Left eval Right 01/28/23 Left 01/28/23  Elbow flexion   5 5  Elbow extension   5 5  Wrist flexion   5 4  Wrist extension   5 4  Thumb MCP (0-60)      Thumb IP (0-80)      Thumb Radial abd/add (0-55)       Thumb  Palmar abd/add (0-45)       Thumb Opposition to Small Finger       Index MCP (0-90)  35   60  Index PIP (0-100)   95  70 (achy)  Index DIP (0-70)   50  50  Long MCP (0-90)        Long PIP (0-100)        Long DIP (0-70)        Ring MCP (0-90)        Ring PIP (0-100)        Ring DIP (0-70)        Little MCP (0-90)        Little PIP (0-100)        Little DIP (0-70)        (Blank rows = not tested)    *Eval:L IF 4 cm to [facility] 01/10/23: L IF 2 cm to [facility] 01/28/23: L IF 2 cm to [facility]  02/07/23: L IF 1.5 cm to [facility]  HAND FUNCTION: Grip strength: Right: 47 lbs; Left: 23 lbs, Lateral pinch: Right: 17 lbs, Left: 13 lbs, 3 point pinch: Right: 17 lbs, Left:  13 lbs, and Tip pinch: Right 9 lbs, Left: 4 lbs 01/10/23: L grip 38 lbs 01/28/23: R grip 46 lbs, L grip 34 lbs; lateral pinch: 17 lbs, Left: 15 lbs   02/07/23: L grip 40 lbs; lateral pinch: Left: 16 lbs, 3 point pinch: 10 lbs  COORDINATION: 9 Hole Peg test: Right: 24 sec; Left: 24 sec  SENSATION: Pt reports numbness in L ulnar nerve distribution  for years; pt reports a neck injury 10+ years ago  01/28/23: L arm numbness constantly; concentrated at L elbow and distally to the hand along ulnar nerve distribution.  Pt reports she frequently drops items in L hand d/t lack of sensation; difficulty putting an earring in d/t lack of sensation.    EDEMA: Mild L volar palm  COGNITION: Overall cognitive status: Within functional limits for tasks assessed  OBSERVATIONS:  Unable to make composite fist on the L hand d/t stiffness in the IF.  Pain and weakness in the L hand limiting efficient use of the L hand with daily tasks.    TODAY'S TREATMENT:                                                                                                                              Paraffin:  X10 min for L hand pain management/muscle relaxation in prep for therapeutic exercises.  Iontophoresis:  20 min tx to L cubital tunnel region; 2cc dexamethasone, 40 mA, 2.0 current; skin check performed following tx; intact/good tolerance.  Manual Therapy: Soft tissue massage performed at the base of the L IF MCP volar palm, and PIP joint of same digit, working to increase circulation, decrease pain, decrease swelling related to trigger finger pain in this digit.  Soft tissue massage completed at the L proximal dorsal elbow, working to  increase circulation and decrease pain at and around cubital tunnel region.     Therapeutic Exercise: Objective measures taken and goals updated for progress note. Performed passive stretching for the L hand IF, including digit abd, digit extension and flexion of L IF MP, PIP and DIP for  improving composite fisting.      PATIENT EDUCATION: Education details: take short, frequent breaks from computer at work to perform BUE stretching  Person educated: Patient Education method: explanation, vc, demo Education comprehension: verbalized understanding  HOME EXERCISE PROGRAM: Ice massage, contrast bath, passive stretching for L hand digits (flex/ext/abd), theraputty, tendon gliding  GOALS: Goals reviewed with patient? Yes   SHORT TERM GOALS: Target date: 02/25/23 (4 weeks)  Pt will be indep to perform HEP for improving LUE flexibility and strength for daily tasks (revised on 01/28/23 to include elbow/wrist/hand). Baseline: Eval: initiated ROM exercises; 01/28/23: Pt reports inconsistent stretching over the last couple of weeks; inconsistent use of theraputty; will plan to add ulnar nerve glides; 02/07/23: Pt reports inconsistent stretching and use of theraputty Goal status: ongoing  2.  Pt will be indep to verbalize 2-3 joint protection/cumulative trauma prevention strategies to reduce pain/paresthesias in the L hand.  Baseline: Eval: initiated educ, further training needed; 01/28/23: indep with joint protection/cumulative trauma prevention strategies Goal status: achieved  LONG TERM GOALS: Target date: 03/25/23 (8 weeks)  Pt will increase FOTO score to 69 or better to indicate improvement in self perceived functional use of the L hand with daily tasks.  Baseline: Eval: 61; 01/28/23: 67; 02/07/23: 67 Goal status: ongoing  2.  Pt will increase L grip strength by 15 or more lbs in order to improve ability to hold and carry ADL supplies (revised 01/28/23).   Baseline: L grip 23 lbs (R 47 lbs); 01/28/23: L grip 34 lbs (R 46 lbs); 02/07/23: L grip 40 lbs Goal status: achieved/revised/ongoing  3.  Pt will increase L IF flexibility to enable pt to make a full composite fist to store ADL supplies without dropping. Baseline: Eval: Unable to make composite fist in L hand; L IF 4 cm from [facility]; 01/28/23:  L IF 2 cm from [facility]; 02/07/23: L IF 1.5 cm from [facility] Goal status: ongoing  4.  Pt will tolerate manual therapy, therapeutic modalities, exercises, and splinting as needed to decrease pain in L hand and elbow to a reported 3/10 pain or less with activity.   Baseline: Eval: L hand pain 7/10 pain with activity (throbs); 01/28/23: 0/10 pain in L hand at rest, tender 2-3/pain with touch (base of L IF, palmar aspect), 3/10 pain in L elbow at rest, can reach 8/10 with activity; 02/07/23: 1-2 L IF/palm, 0 L elbow (not yet consistent) Goal status: ongoing; revised 01/28/23 to include elbow   5. With use of compensatory strategies, pt will be able to independently and efficiently clasp an earring in her L ear. Baseline: Pt reports that with the numbness from her L elbow to the hand, clasping an earring in L ear take extensive time and trials; 02/07/23: extensive time and trials  Goal status: ongoing  ASSESSMENT: CLINICAL IMPRESSION: Pt seen for 10th visit progress update.  Pt reports L elbow doesn't seem to be as flared up this week.  Good tolerance to passive stretching and soft tissue massage today.  Pt tolerated 2nd tx of iontophoresis to L cubital tunnel region well today.  L hand IF PIP joint and volar surface of palm at the MP joint of this finger remain tender.  Pt continues to report stiffness in the L hand but verbalizes inconsistency with stretching finger.  Pain has improved overall in the L hand and elbow.  L grip is improving, but pt still avoids carrying items over 10 lbs in the L hand.  Pt will benefit from continued skilled OT to progress HEP, provide modalities for pain management including iontophoresis at the L elbow, and manual therapy for pain management in order to increase tolerance and functional use of the LUE with daily tasks, specifically being able to manipulate small items and carry items in L hand without dropping.    PERFORMANCE DEFICITS: in functional skills including ADLs, IADLs,  coordination, dexterity, edema, ROM, strength, pain, flexibility, Fine motor control, decreased knowledge of precautions, decreased knowledge of use of DME, and UE functional use. IMPAIRMENTS: are limiting patient from ADLs, IADLs, work, and leisure.   COMORBIDITIES: has co-morbidities such as hx of tumor resection in a R sided meningioma, LUE parasthesias from possible cubital tunnel and cervical radiculopathy  that affects occupational performance. Patient will benefit from skilled OT to address above impairments and improve overall function.  MODIFICATION OR ASSISTANCE TO COMPLETE EVALUATION: No modification of tasks or assist necessary to complete an evaluation.  OT OCCUPATIONAL PROFILE AND HISTORY: Problem focused assessment: Including review of records relating to presenting problem.  CLINICAL DECISION MAKING: Moderate - several treatment options, min-mod task modification necessary  REHAB POTENTIAL: Good  EVALUATION COMPLEXITY: Moderate      PLAN:  OT FREQUENCY: 2x/week  OT DURATION: 8 weeks  PLANNED INTERVENTIONS: self care/ADL training, therapeutic exercise, therapeutic activity, neuromuscular re-education, manual therapy, passive range of motion, splinting, ultrasound, iontophoresis, paraffin, moist heat, cryotherapy, contrast bath, patient/family education, and DME and/or AE instructions  RECOMMENDED OTHER SERVICES: None at this time  CONSULTED AND AGREED WITH PLAN OF CARE: Patient  PLAN FOR NEXT SESSION: see above  Danelle Earthly, MS, OTR/L  Otis Dials, OT 02/10/2023, 2:11 PM

## 2023-02-11 ENCOUNTER — Ambulatory Visit: Payer: BC Managed Care – PPO

## 2023-02-11 DIAGNOSIS — M6281 Muscle weakness (generalized): Secondary | ICD-10-CM | POA: Diagnosis not present

## 2023-02-11 DIAGNOSIS — M65322 Trigger finger, left index finger: Secondary | ICD-10-CM

## 2023-02-11 DIAGNOSIS — R202 Paresthesia of skin: Secondary | ICD-10-CM

## 2023-02-11 NOTE — Therapy (Addendum)
OUTPATIENT OCCUPATIONAL THERAPY ORTHO TREATMENT NOTE  Patient Name: Stephanie Francis MRN: [id number] DOB:September 26, 1965, 57 y.o., female Today's Date: 02/12/2023  PCP: Dr. Maurine Minister REFERRING PROVIDER: Dr. Cristopher Peru  END OF SESSION:  OT End of Session - 02/12/23 0948     Visit Number 11    Number of Visits 23    Date for OT Re-Evaluation 03/25/23    Authorization Time Period Reporting period beginning 02/07/23    Progress Note Due on Visit 10    OT Start Time 1600    OT Stop Time 1645    OT Time Calculation (min) 45 min    Equipment Utilized During Treatment none    Activity Tolerance Patient tolerated treatment well    Behavior During Therapy [facility] for tasks assessed/performed             No past medical history on file. No past surgical history on file. There are no problems to display for this patient.  ONSET DATE: October 2023  REFERRING DIAG: Trigger Finger of L index finger  THERAPY DIAG:  Muscle weakness (generalized)  Trigger index finger of left hand  Left hand paresthesia  Rationale for Evaluation and Treatment: Rehabilitation  SUBJECTIVE:  SUBJECTIVE STATEMENT: Pt reports L IF triggered once this morning and finger is sore. 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  Per chart/Dr. Sherryll Burger: Left hand paresthesia in a patient with cervical radiculopathy, some component of carpal tunnel syndrome, weakness - concerning for central component, possible cubital tunnel syndrome component. - Negative ESR, C Reactive Protein, ANA, Rheumatoid Arthritis Factor - Recommend NCS upper extremity MRI C-Spine without contrast - we will do Occupational Therapy consult Left hand trigger finger - ortho consult  PRECAUTIONS: None  WEIGHT BEARING RESTRICTIONS: No  PAIN: 02/11/23: Pt reports 0/10 L elbow, L hand 1-2/10 pain Are you having pain? Yes: NPRS scale:   intermittently when throbbing 7/10 Pain location: L elbow, base of L IF palmar aspect Pain description: throbbing, tender Aggravating factors: gripping/lifting/carrying heavy objects Relieving factors: rest/avoiding use of the L hand  LIVING ENVIRONMENT: Lives with: lives alone  PLOF: Independent, works as an Environmental health practitioner for [employer]   PATIENT GOALS: "To avoid surgery and not to hurt."  NEXT MD VISIT: Return to Dr. Sherryll Burger June 13th   OBJECTIVE:   HAND DOMINANCE: Right   ADLs: Overall ADLs: indep but some pain in the L hand with gripping/pinching  FUNCTIONAL OUTCOME MEASURES: FOTO: 61; 69 01/28/23: 67 02/11/23: 67  UPPER EXTREMITY ROM:     Active ROM Right eval Left eval Right 01/28/23 Left  01/28/23  Elbow flexion   WNL WNL  Elbow extension   WNL WNL  Wrist flexion 74 66  70  Wrist extension 70 71  70  Wrist ulnar deviation      Wrist radial deviation      Wrist pronation   90 90  Wrist supination   64 70  (Blank rows = not tested)  Active ROM Right eval Left eval Right 01/28/23 Left 01/28/23  Elbow flexion   5 5  Elbow extension   5 5  Wrist flexion   5 4  Wrist extension   5 4  Thumb MCP (0-60)      Thumb IP (0-80)      Thumb Radial abd/add (0-55)       Thumb Palmar abd/add (0-45)  Thumb Opposition to Small Finger       Index MCP (0-90)  35   60  Index PIP (0-100)   95  70 (achy)  Index DIP (0-70)   50  50  Long MCP (0-90)        Long PIP (0-100)        Long DIP (0-70)        Ring MCP (0-90)        Ring PIP (0-100)        Ring DIP (0-70)        Little MCP (0-90)        Little PIP (0-100)        Little DIP (0-70)        (Blank rows = not tested)    *Eval:L IF 4 cm from [facility] 01/10/23: L IF 2 cm to [facility] 01/28/23: L IF 2 cm [facility]  02/07/23: L IF 1.5 cm to [facility]  HAND FUNCTION: Grip strength: Right: 47 lbs; Left: 23 lbs, Lateral pinch: Right: 17 lbs, Left: 13 lbs, 3 point pinch: Right: 17 lbs, Left: 13 lbs, and Tip pinch: Right 9 lbs, Left: 4  lbs 01/10/23: L grip 38 lbs 01/28/23: R grip 46 lbs, L grip 34 lbs; lateral pinch: 17 lbs, 3 point pinch left: 15 lbs,  02/11/23: L grip 40, lateral pinch: 16 lbs, 3 point pinch left: 10 lbs  COORDINATION: 9 Hole Peg test: Right: 24 sec; Left: 24 sec  SENSATION: Pt reports numbness in L ulnar nerve distribution  for years; pt reports a neck injury 10+ years ago  01/28/23: L arm numbness constantly; concentrated at L elbow and distally to the hand along ulnar nerve distribution.  Pt reports she frequently drops items in L hand d/t lack of sensation; difficulty putting an earring in d/t lack of sensation.    EDEMA: Mild L volar palm  COGNITION: Overall cognitive status: Within functional limits for tasks assessed  OBSERVATIONS:  Unable to make composite fist on the L hand d/t stiffness in the IF.  Pain and weakness in the L hand limiting efficient use of the L hand with daily tasks.    TODAY'S TREATMENT:                                                                                                                              Paraffin:  X10 min for L hand pain management/muscle relaxation in prep for therapeutic exercises.  Iontophoresis:  20 min tx to L cubital tunnel region; 2cc dexamethasone, 40 mA, 2.0 current; skin check performed following tx; intact/good tolerance.  Therapeutic Exercise: HEP review; pt verbalizes inconsistent completion of HEP.  Review avoiding putty at this time d/t L IF triggering this morning and more sore this week.  Reviewed importance of frequent daily stretches at work for finger ext/abd, elbow extension, shoulder flex/abd/ER, and ulnar nerve glides.  Also reinforced importance of passive stretching for L IF flexion at  the DIP, PIP, and MP but to avoid repetition, rather perform slow/prolonged stretch for maximizing composite fist.  PATIENT EDUCATION: Education details: take short, frequent breaks from computer at work to perform BUE stretching  Person  educated: Patient Education method: explanation, vc, demo Education comprehension: verbalized understanding  HOME EXERCISE PROGRAM: Ice massage, contrast bath, passive stretching for L hand digits (flex/ext/abd), theraputty, tendon gliding  GOALS: Goals reviewed with patient? Yes   SHORT TERM GOALS: Target date: 02/25/23 (4 weeks)  Pt will be indep to perform HEP for improving LUE flexibility and strength for daily tasks (revised on 01/28/23 to include elbow/wrist/hand). Baseline: Eval: initiated ROM exercises; 01/28/23: Pt reports inconsistent stretching over the last couple of weeks; inconsistent use of theraputty; will plan to add ulnar nerve glides Goal status: ongoing  2.  Pt will be indep to verbalize 2-3 joint protection/cumulative trauma prevention strategies to reduce pain/paresthesias in the L hand.  Baseline: Eval: initiated educ, further training needed; 01/28/23: indep with joint protection/cumulative trauma prevention strategies Goal status: achieved  LONG TERM GOALS: Target date: 03/25/23 (8 weeks)  Pt will increase FOTO score to 69 or better to indicate improvement in self perceived functional use of the L hand with daily tasks.  Baseline: Eval: 61; 01/28/23: 67; 02/11/23: 67 Goal status: achieved  2.  Pt will increase L grip strength by 15 or more lbs in order to improve ability to hold and carry ADL supplies (revised 01/28/23).   Baseline: L grip 23 lbs (R 47 lbs); 01/28/23: L grip 34 lbs (R 46 lbs); 02/11/23: L grip 35 lbs Goal status: achieved/revised/ongoing  3.  Pt will increase L IF flexibility to enable pt to make a full composite fist to store ADL supplies without dropping. Baseline: Eval: Unable to make composite fist in L hand; L IF 4 cm from [facility]; 01/28/23: L IF 2 cm from [facility] Goal status: ongoing  4.  Pt will tolerate manual therapy, therapeutic modalities, exercises, and splinting as needed to decrease pain in L hand and elbow to a reported 3/10 pain or less with activity.    Baseline: Eval: L hand pain 7/10 pain with activity (throbs); 01/28/23: 0/10 pain in L hand at rest, tender 2-3/pain with touch (base of L IF, palmar aspect), 3/10 pain in L elbow at rest, can reach 8/10 with activity Goal status: revised 01/28/23 to include elbow   5. With use of compensatory strategies, pt will be able to independently and efficiently clasp an earring in her L ear.  Baseline: Pt reports that with the numbness from her L elbow to the hand, clasping an earring in L ear take extensive time and trials.  Goal status: New  ASSESSMENT: CLINICAL IMPRESSION: Pt reports L IF triggered once this morning and finger is sore.  Pt continues to verbalize inconsistency with stretching HEP and has not been wearing her oval 8 splint for the L IF PIP as she has misplaced it.  Pt plans to look for it at home.  OT reviewed HEP and importance of frequent daily stretches at work for finger ext/abd, elbow extension, shoulder flex/abd/ER, and ulnar nerve glides.  Also reinforced importance of passive stretching for L IF flexion at the DIP, PIP, and MP but to avoid repetition, rather perform slow/prolonged stretch for maximizing composite fist.  Overall, pt reports L elbow feels less numb, likely a combination of iontophoresis and changing desk set up at work.  Pt completed 3rd ionto tx this date with good tolerance.  Pt will  benefit from continued skilled OT to progress HEP, provide modalities for pain management including iontophoresis at the L elbow, and manual therapy for pain management in order to increase tolerance and functional use of the LUE with daily tasks, specifically being able to manipulate small items and carry items in L hand without dropping.    PERFORMANCE DEFICITS: in functional skills including ADLs, IADLs, coordination, dexterity, edema, ROM, strength, pain, flexibility, Fine motor control, decreased knowledge of precautions, decreased knowledge of use of DME, and UE functional  use. IMPAIRMENTS: are limiting patient from ADLs, IADLs, work, and leisure.   COMORBIDITIES: has co-morbidities such as hx of tumor resection in a R sided meningioma, LUE parasthesias from possible cubital tunnel and cervical radiculopathy  that affects occupational performance. Patient will benefit from skilled OT to address above impairments and improve overall function.  MODIFICATION OR ASSISTANCE TO COMPLETE EVALUATION: No modification of tasks or assist necessary to complete an evaluation.  OT OCCUPATIONAL PROFILE AND HISTORY: Problem focused assessment: Including review of records relating to presenting problem.  CLINICAL DECISION MAKING: Moderate - several treatment options, min-mod task modification necessary  REHAB POTENTIAL: Good  EVALUATION COMPLEXITY: Moderate      PLAN:  OT FREQUENCY: 2x/week  OT DURATION: 8 weeks  PLANNED INTERVENTIONS: self care/ADL training, therapeutic exercise, therapeutic activity, neuromuscular re-education, manual therapy, passive range of motion, splinting, ultrasound, iontophoresis, paraffin, moist heat, cryotherapy, contrast bath, patient/family education, and DME and/or AE instructions  RECOMMENDED OTHER SERVICES: None at this time  CONSULTED AND AGREED WITH PLAN OF CARE: Patient  PLAN FOR NEXT SESSION: see above  Danelle Earthly, MS, OTR/L  Otis Dials, OT 02/12/2023, 9:53 AM

## 2023-02-13 ENCOUNTER — Ambulatory Visit: Payer: BC Managed Care – PPO

## 2023-02-13 DIAGNOSIS — R202 Paresthesia of skin: Secondary | ICD-10-CM

## 2023-02-13 DIAGNOSIS — M79645 Pain in left finger(s): Secondary | ICD-10-CM

## 2023-02-13 DIAGNOSIS — M6281 Muscle weakness (generalized): Secondary | ICD-10-CM | POA: Diagnosis not present

## 2023-02-15 NOTE — Therapy (Signed)
OUTPATIENT OCCUPATIONAL THERAPY ORTHO TREATMENT NOTE  Patient Name: Stephanie Francis MRN: [id number] DOB:[birth year], 57 y.o., female Today's Date: 02/15/2023  PCP: Dr. Maurine Minister REFERRING PROVIDER: Dr. Cristopher Peru  END OF SESSION:  OT End of Session - 02/15/23 0903     Visit Number 12    Number of Visits 23    Date for OT Re-Evaluation 03/25/23    Authorization Time Period Reporting period beginning 02/07/23    Progress Note Due on Visit 10    OT Start Time 1605    OT Stop Time 1645    OT Time Calculation (min) 40 min    Equipment Utilized During Treatment none    Activity Tolerance Patient tolerated treatment well    Behavior During Therapy [facility] for tasks assessed/performed             No past medical history on file. No past surgical history on file. There are no problems to display for this patient.  ONSET DATE: October 2023  REFERRING DIAG: Trigger Finger of L index finger  THERAPY DIAG:  Muscle weakness (generalized)  Left hand paresthesia  Pain in left finger(s)  Rationale for Evaluation and Treatment: Rehabilitation  SUBJECTIVE:  SUBJECTIVE STATEMENT: Pt acknowledges that she needs to get back into doing her stretches and icing for her L hand. 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  Per chart/Dr. Sherryll Burger: Left hand paresthesia in a patient with cervical radiculopathy, some component of carpal tunnel syndrome, weakness - concerning for central component, possible cubital tunnel syndrome component. - Negative ESR, C Reactive Protein, ANA, Rheumatoid Arthritis Factor - Recommend NCS upper extremity MRI C-Spine without contrast - we will do Occupational Therapy consult Left hand trigger finger - ortho consult  PRECAUTIONS: None  WEIGHT BEARING RESTRICTIONS: No  PAIN: 02/13/23: Pt reports 0/10 L elbow, L hand 1-2/10 pain Are you having pain?  Yes: NPRS scale:  intermittently when throbbing 7/10 Pain location: L elbow, base of L IF palmar aspect Pain description: throbbing, tender Aggravating factors: gripping/lifting/carrying heavy objects Relieving factors: rest/avoiding use of the L hand  LIVING ENVIRONMENT: Lives with: lives alone  PLOF: Independent, works as an Environmental health practitioner for [employer]   PATIENT GOALS: "To avoid surgery and not to hurt."  NEXT MD VISIT: Return to Dr. Sherryll Burger June 13th   OBJECTIVE:   HAND DOMINANCE: Right   ADLs: Overall ADLs: indep but some pain in the L hand with gripping/pinching  FUNCTIONAL OUTCOME MEASURES: FOTO: 61; 69 01/28/23: 67 02/11/23: 67  UPPER EXTREMITY ROM:     Active ROM Right eval Left eval Right 01/28/23 Left  01/28/23  Elbow flexion   WNL WNL  Elbow extension   WNL WNL  Wrist flexion 74 66  70  Wrist extension 70 71  70  Wrist ulnar deviation      Wrist radial deviation      Wrist pronation   90 90  Wrist supination   64 70  (Blank rows = not tested)  Active ROM Right eval Left eval Right 01/28/23 Left 01/28/23  Elbow flexion   5 5  Elbow extension   5 5  Wrist flexion   5 4  Wrist extension   5 4  Thumb MCP (0-60)      Thumb IP (0-80)      Thumb Radial abd/add (0-55)       Thumb Palmar  abd/add (0-45)       Thumb Opposition to Small Finger       Index MCP (0-90)  35   60  Index PIP (0-100)   95  70 (achy)  Index DIP (0-70)   50  50  Long MCP (0-90)        Long PIP (0-100)        Long DIP (0-70)        Ring MCP (0-90)        Ring PIP (0-100)        Ring DIP (0-70)        Little MCP (0-90)        Little PIP (0-100)        Little DIP (0-70)        (Blank rows = not tested)    *Eval:L IF 4 cm from [facility] 01/10/23: L IF 2 cm to [facility] 01/28/23: L IF 2 cm [facility]  02/07/23: L IF 1.5 cm to [facility]  HAND FUNCTION: Grip strength: Right: 47 lbs; Left: 23 lbs, Lateral pinch: Right: 17 lbs, Left: 13 lbs, 3 point pinch: Right: 17 lbs, Left: 13 lbs, and Tip pinch: Right 9  lbs, Left: 4 lbs 01/10/23: L grip 38 lbs 01/28/23: R grip 46 lbs, L grip 34 lbs; lateral pinch: 17 lbs, 3 point pinch left: 15 lbs,  02/11/23: L grip 40, lateral pinch: 16 lbs, 3 point pinch left: 10 lbs  COORDINATION: 9 Hole Peg test: Right: 24 sec; Left: 24 sec  SENSATION: Pt reports numbness in L ulnar nerve distribution  for years; pt reports a neck injury 10+ years ago  01/28/23: L arm numbness constantly; concentrated at L elbow and distally to the hand along ulnar nerve distribution.  Pt reports she frequently drops items in L hand d/t lack of sensation; difficulty putting an earring in d/t lack of sensation.    EDEMA: Mild L volar palm  COGNITION: Overall cognitive status: Within functional limits for tasks assessed  OBSERVATIONS:  Unable to make composite fist on the L hand d/t stiffness in the IF.  Pain and weakness in the L hand limiting efficient use of the L hand with daily tasks.    TODAY'S TREATMENT:                                                                                                                              Paraffin:  X10 min for L hand pain management/muscle relaxation in prep for therapeutic exercises.  Iontophoresis:  20 min tx to L cubital tunnel region; 2cc dexamethasone, 40 mA, 2.0 current; skin check performed following tx; intact/good tolerance.  Manual Therapy: Soft tissue massage performed at the base of the L IF MCP volar palm, and PIP joint of same digit, working to increase circulation, decrease pain, decrease swelling related to trigger finger pain in this digit. Soft tissue massage completed at the L proximal dorsal elbow, working to increase circulation  and decrease pain at and around cubital tunnel region.   PATIENT EDUCATION: Education details: ice massage for pain management, self PROM/AROM for decreasing stiffness in L hand digits Person educated: Patient Education method: explanation, vc, demo Education comprehension: verbalized  understanding  HOME EXERCISE PROGRAM: Ice massage, contrast bath, passive stretching for L hand digits (flex/ext/abd), theraputty, tendon gliding  GOALS: Goals reviewed with patient? Yes   SHORT TERM GOALS: Target date: 02/25/23 (4 weeks)  Pt will be indep to perform HEP for improving LUE flexibility and strength for daily tasks (revised on 01/28/23 to include elbow/wrist/hand). Baseline: Eval: initiated ROM exercises; 01/28/23: Pt reports inconsistent stretching over the last couple of weeks; inconsistent use of theraputty; will plan to add ulnar nerve glides Goal status: ongoing  2.  Pt will be indep to verbalize 2-3 joint protection/cumulative trauma prevention strategies to reduce pain/paresthesias in the L hand.  Baseline: Eval: initiated educ, further training needed; 01/28/23: indep with joint protection/cumulative trauma prevention strategies Goal status: achieved  LONG TERM GOALS: Target date: 03/25/23 (8 weeks)  Pt will increase FOTO score to 69 or better to indicate improvement in self perceived functional use of the L hand with daily tasks.  Baseline: Eval: 61; 01/28/23: 67; 02/11/23: 67 Goal status: achieved  2.  Pt will increase L grip strength by 15 or more lbs in order to improve ability to hold and carry ADL supplies (revised 01/28/23).   Baseline: L grip 23 lbs (R 47 lbs); 01/28/23: L grip 34 lbs (R 46 lbs); 02/11/23: L grip 35 lbs Goal status: achieved/revised/ongoing  3.  Pt will increase L IF flexibility to enable pt to make a full composite fist to store ADL supplies without dropping. Baseline: Eval: Unable to make composite fist in L hand; L IF 4 cm from [facility]; 01/28/23: L IF 2 cm from [facility] Goal status: ongoing  4.  Pt will tolerate manual therapy, therapeutic modalities, exercises, and splinting as needed to decrease pain in L hand and elbow to a reported 3/10 pain or less with activity.   Baseline: Eval: L hand pain 7/10 pain with activity (throbs); 01/28/23: 0/10 pain in L hand at  rest, tender 2-3/pain with touch (base of L IF, palmar aspect), 3/10 pain in L elbow at rest, can reach 8/10 with activity Goal status: revised 01/28/23 to include elbow   5. With use of compensatory strategies, pt will be able to independently and efficiently clasp an earring in her L ear.  Baseline: Pt reports that with the numbness from her L elbow to the hand, clasping an earring in L ear take extensive time and trials.  Goal status: New  ASSESSMENT: CLINICAL IMPRESSION: Pt continues to verbalize inconsistency with stretching HEP and has not been wearing her oval 8 splint for the L IF PIP as she has misplaced it.  Issued another oval 8 splint this date.  Pt acknowledges that she needs to get back into doing her stretches and icing for her L hand.  Overall, pt reports L elbow feels less numb.  Pt continues to tolerate iontophoresis treatments well and completed her 4th ionto tx this date.  Pt will benefit from continued skilled OT to progress HEP, provide modalities for pain management including iontophoresis at the L elbow, and manual therapy for pain management in order to increase tolerance and functional use of the LUE with daily tasks, specifically being able to manipulate small items and carry items in L hand without dropping.    PERFORMANCE DEFICITS: in  functional skills including ADLs, IADLs, coordination, dexterity, edema, ROM, strength, pain, flexibility, Fine motor control, decreased knowledge of precautions, decreased knowledge of use of DME, and UE functional use. IMPAIRMENTS: are limiting patient from ADLs, IADLs, work, and leisure.   COMORBIDITIES: has co-morbidities such as hx of tumor resection in a R sided meningioma, LUE parasthesias from possible cubital tunnel and cervical radiculopathy  that affects occupational performance. Patient will benefit from skilled OT to address above impairments and improve overall function.  MODIFICATION OR ASSISTANCE TO COMPLETE EVALUATION: No  modification of tasks or assist necessary to complete an evaluation.  OT OCCUPATIONAL PROFILE AND HISTORY: Problem focused assessment: Including review of records relating to presenting problem.  CLINICAL DECISION MAKING: Moderate - several treatment options, min-mod task modification necessary  REHAB POTENTIAL: Good  EVALUATION COMPLEXITY: Moderate      PLAN:  OT FREQUENCY: 2x/week  OT DURATION: 8 weeks  PLANNED INTERVENTIONS: self care/ADL training, therapeutic exercise, therapeutic activity, neuromuscular re-education, manual therapy, passive range of motion, splinting, ultrasound, iontophoresis, paraffin, moist heat, cryotherapy, contrast bath, patient/family education, and DME and/or AE instructions  RECOMMENDED OTHER SERVICES: None at this time  CONSULTED AND AGREED WITH PLAN OF CARE: Patient  PLAN FOR NEXT SESSION: see above  Danelle Earthly, MS, OTR/L  Otis Dials, OT 02/15/2023, 9:05 AM

## 2023-02-19 ENCOUNTER — Ambulatory Visit: Payer: BC Managed Care – PPO

## 2023-02-19 DIAGNOSIS — M79645 Pain in left finger(s): Secondary | ICD-10-CM

## 2023-02-19 DIAGNOSIS — M6281 Muscle weakness (generalized): Secondary | ICD-10-CM

## 2023-02-19 DIAGNOSIS — R202 Paresthesia of skin: Secondary | ICD-10-CM

## 2023-02-20 NOTE — Therapy (Signed)
OUTPATIENT OCCUPATIONAL THERAPY ORTHO TREATMENT NOTE  Patient Name: Stephanie Francis MRN: [id number] DOB:10-21-1965, 57 y.o., female Today's Date: 02/20/2023  PCP: Dr. Maurine Minister REFERRING PROVIDER: Dr. Cristopher Peru  END OF SESSION:  OT End of Session - 02/20/23 0911     Visit Number 13    Number of Visits 23    Date for OT Re-Evaluation 03/25/23    Authorization Time Period Reporting period beginning 02/07/23    Progress Note Due on Visit 10    OT Start Time 1645    OT Stop Time 1730    OT Time Calculation (min) 45 min    Equipment Utilized During Treatment none    Activity Tolerance Patient tolerated treatment well    Behavior During Therapy [facility] for tasks assessed/performed             No past medical history on file. No past surgical history on file. There are no problems to display for this patient.  ONSET DATE: October 2023  REFERRING DIAG: Trigger Finger of L index finger  THERAPY DIAG:  Muscle weakness (generalized)  Left hand paresthesia  Pain in left finger(s)  Rationale for Evaluation and Treatment: Rehabilitation  SUBJECTIVE:  SUBJECTIVE STATEMENT: Pt reports she will have her EMG next week for the LUE. 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  Per chart/Dr. Sherryll Burger: Left hand paresthesia in a patient with cervical radiculopathy, some component of carpal tunnel syndrome, weakness - concerning for central component, possible cubital tunnel syndrome component. - Negative ESR, C Reactive Protein, ANA, Rheumatoid Arthritis Factor - Recommend NCS upper extremity MRI C-Spine without contrast - we will do Occupational Therapy consult Left hand trigger finger - ortho consult  PRECAUTIONS: None  WEIGHT BEARING RESTRICTIONS: No  PAIN: 02/13/23: Pt reports 0/10 L elbow, L hand 1-2/10 pain Are you having pain? Yes: NPRS scale:  intermittently when  throbbing 7/10 Pain location: L elbow, base of L IF palmar aspect Pain description: throbbing, tender Aggravating factors: gripping/lifting/carrying heavy objects Relieving factors: rest/avoiding use of the L hand  LIVING ENVIRONMENT: Lives with: lives alone  PLOF: Independent, works as an Environmental health practitioner for [employer]   PATIENT GOALS: "To avoid surgery and not to hurt."  NEXT MD VISIT: Return to Dr. Sherryll Burger June 13th   OBJECTIVE:   HAND DOMINANCE: Right   ADLs: Overall ADLs: indep but some pain in the L hand with gripping/pinching  FUNCTIONAL OUTCOME MEASURES: FOTO: 61; 69 01/28/23: 67 02/11/23: 67  UPPER EXTREMITY ROM:     Active ROM Right eval Left eval Right 01/28/23 Left  01/28/23  Elbow flexion   WNL WNL  Elbow extension   WNL WNL  Wrist flexion 74 66  70  Wrist extension 70 71  70  Wrist ulnar deviation      Wrist radial deviation      Wrist pronation   90 90  Wrist supination   64 70  (Blank rows = not tested)  Active ROM Right eval Left eval Right 01/28/23 Left 01/28/23  Elbow flexion   5 5  Elbow extension   5 5  Wrist flexion   5 4  Wrist extension   5 4  Thumb MCP (0-60)      Thumb IP (0-80)      Thumb Radial abd/add (0-55)       Thumb Palmar abd/add (0-45)  Thumb Opposition to Small Finger       Index MCP (0-90)  35   60  Index PIP (0-100)   95  70 (achy)  Index DIP (0-70)   50  50  Long MCP (0-90)        Long PIP (0-100)        Long DIP (0-70)        Ring MCP (0-90)        Ring PIP (0-100)        Ring DIP (0-70)        Little MCP (0-90)        Little PIP (0-100)        Little DIP (0-70)        (Blank rows = not tested)    *Eval:L IF 4 cm from [facility] 01/10/23: L IF 2 cm to [facility] 01/28/23: L IF 2 cm [facility]  02/07/23: L IF 1.5 cm to [facility]  HAND FUNCTION: Grip strength: Right: 47 lbs; Left: 23 lbs, Lateral pinch: Right: 17 lbs, Left: 13 lbs, 3 point pinch: Right: 17 lbs, Left: 13 lbs, and Tip pinch: Right 9 lbs, Left: 4 lbs 01/10/23: L grip 38  lbs 01/28/23: R grip 46 lbs, L grip 34 lbs; lateral pinch: 17 lbs, 3 point pinch left: 15 lbs,  02/11/23: L grip 40, lateral pinch: 16 lbs, 3 point pinch left: 10 lbs  COORDINATION: 9 Hole Peg test: Right: 24 sec; Left: 24 sec  SENSATION: Pt reports numbness in L ulnar nerve distribution  for years; pt reports a neck injury 10+ years ago  01/28/23: L arm numbness constantly; concentrated at L elbow and distally to the hand along ulnar nerve distribution.  Pt reports she frequently drops items in L hand d/t lack of sensation; difficulty putting an earring in d/t lack of sensation.    EDEMA: Mild L volar palm  COGNITION: Overall cognitive status: Within functional limits for tasks assessed  OBSERVATIONS:  Unable to make composite fist on the L hand d/t stiffness in the IF.  Pain and weakness in the L hand limiting efficient use of the L hand with daily tasks.    TODAY'S TREATMENT:                                                                                                                              Paraffin:  X10 min for L hand pain management/muscle relaxation in prep for therapeutic exercises.  Iontophoresis:  20 min tx to L cubital tunnel region; 2cc dexamethasone, 40 mA, 2.0 current; skin check performed following tx; intact/good tolerance.  Manual Therapy: Soft tissue massage performed at the base of the L IF MCP volar palm, and PIP joint of same digit, working to increase circulation, decrease pain, decrease swelling related to trigger finger pain in this digit. Soft tissue massage completed at the L proximal dorsal elbow, working to increase circulation and decrease pain at and around cubital tunnel  region.   Therapeutic Exercise: HEP review.  Completed ulnar nerve glides bilaterally x3 reps each side.  Performed 1.5# dowel stretch for bilat shoulder flex and ER behind head, min vc for form, working to reduce bilat shoulder stiffness and promote erect posture.   Performed passive  stretch for L hand finger ext/abd, passive composite fist and isolated L IF MP, PIP, DIP flex/ext stretch.     PATIENT EDUCATION: Education details: ice massage for pain management, self PROM/AROM for decreasing stiffness in L hand digits Person educated: Patient Education method: explanation, vc, demo Education comprehension: verbalized understanding  HOME EXERCISE PROGRAM: Ice massage, contrast bath, passive stretching for L hand digits (flex/ext/abd), theraputty, tendon gliding  GOALS: Goals reviewed with patient? Yes   SHORT TERM GOALS: Target date: 02/25/23 (4 weeks)  Pt will be indep to perform HEP for improving LUE flexibility and strength for daily tasks (revised on 01/28/23 to include elbow/wrist/hand). Baseline: Eval: initiated ROM exercises; 01/28/23: Pt reports inconsistent stretching over the last couple of weeks; inconsistent use of theraputty; will plan to add ulnar nerve glides Goal status: ongoing  2.  Pt will be indep to verbalize 2-3 joint protection/cumulative trauma prevention strategies to reduce pain/paresthesias in the L hand.  Baseline: Eval: initiated educ, further training needed; 01/28/23: indep with joint protection/cumulative trauma prevention strategies Goal status: achieved  LONG TERM GOALS: Target date: 03/25/23 (8 weeks)  Pt will increase FOTO score to 69 or better to indicate improvement in self perceived functional use of the L hand with daily tasks.  Baseline: Eval: 61; 01/28/23: 67; 02/11/23: 67 Goal status: achieved  2.  Pt will increase L grip strength by 15 or more lbs in order to improve ability to hold and carry ADL supplies (revised 01/28/23).   Baseline: L grip 23 lbs (R 47 lbs); 01/28/23: L grip 34 lbs (R 46 lbs); 02/11/23: L grip 35 lbs Goal status: achieved/revised/ongoing  3.  Pt will increase L IF flexibility to enable pt to make a full composite fist to store ADL supplies without dropping. Baseline: Eval: Unable to make composite fist in L hand; L  IF 4 cm from [facility]; 01/28/23: L IF 2 cm from [facility] Goal status: ongoing  4.  Pt will tolerate manual therapy, therapeutic modalities, exercises, and splinting as needed to decrease pain in L hand and elbow to a reported 3/10 pain or less with activity.   Baseline: Eval: L hand pain 7/10 pain with activity (throbs); 01/28/23: 0/10 pain in L hand at rest, tender 2-3/pain with touch (base of L IF, palmar aspect), 3/10 pain in L elbow at rest, can reach 8/10 with activity Goal status: revised 01/28/23 to include elbow   5. With use of compensatory strategies, pt will be able to independently and efficiently clasp an earring in her L ear.  Baseline: Pt reports that with the numbness from her L elbow to the hand, clasping an earring in L ear take extensive time and trials.  Goal status: New  ASSESSMENT: CLINICAL IMPRESSION: Pt reports she will have her EMG next week for the LUE.  Pt states that she's been squeezing and pinching her putty, but avoiding high reps to prevent triggering of L hand IF.  Pt arrived with her oval 8 splint for L IF and reports good carryover with removing splint intermittently throughout day for stretching LF to prevent stiffness.  Pt reports L elbow was slightly more sore over the weekend after doing yard work which consisted of lifting walk way  stones.  OT reviewed joint protection strategies/activity modifications, including using a rolling cart for transport of heavier items.  Pt reported that she briefly used a dolly but not for the entire activity.  Pt reports no more soreness this date in the elbow.  Pt continues to tolerate iontophoresis treatments well and completed her 5th ionto tx this date.  L hand IF and palm at base of IF remain tender and tight.  Pt will benefit from continued skilled OT to progress HEP, provide modalities for pain management including iontophoresis at the L elbow, and manual therapy for pain management in order to increase tolerance and functional use of the LUE  with daily tasks, specifically being able to manipulate small items and carry items in L hand without dropping.    PERFORMANCE DEFICITS: in functional skills including ADLs, IADLs, coordination, dexterity, edema, ROM, strength, pain, flexibility, Fine motor control, decreased knowledge of precautions, decreased knowledge of use of DME, and UE functional use. IMPAIRMENTS: are limiting patient from ADLs, IADLs, work, and leisure.   COMORBIDITIES: has co-morbidities such as hx of tumor resection in a R sided meningioma, LUE parasthesias from possible cubital tunnel and cervical radiculopathy  that affects occupational performance. Patient will benefit from skilled OT to address above impairments and improve overall function.  MODIFICATION OR ASSISTANCE TO COMPLETE EVALUATION: No modification of tasks or assist necessary to complete an evaluation.  OT OCCUPATIONAL PROFILE AND HISTORY: Problem focused assessment: Including review of records relating to presenting problem.  CLINICAL DECISION MAKING: Moderate - several treatment options, min-mod task modification necessary  REHAB POTENTIAL: Good  EVALUATION COMPLEXITY: Moderate      PLAN:  OT FREQUENCY: 2x/week  OT DURATION: 8 weeks  PLANNED INTERVENTIONS: self care/ADL training, therapeutic exercise, therapeutic activity, neuromuscular re-education, manual therapy, passive range of motion, splinting, ultrasound, iontophoresis, paraffin, moist heat, cryotherapy, contrast bath, patient/family education, and DME and/or AE instructions  RECOMMENDED OTHER SERVICES: None at this time  CONSULTED AND AGREED WITH PLAN OF CARE: Patient  PLAN FOR NEXT SESSION: see above  Danelle Earthly, MS, OTR/L  Otis Dials, OT 02/20/2023, 9:21 AM

## 2023-02-21 ENCOUNTER — Ambulatory Visit: Payer: BC Managed Care – PPO

## 2023-02-21 DIAGNOSIS — M6281 Muscle weakness (generalized): Secondary | ICD-10-CM

## 2023-02-21 DIAGNOSIS — R202 Paresthesia of skin: Secondary | ICD-10-CM

## 2023-02-21 DIAGNOSIS — M79645 Pain in left finger(s): Secondary | ICD-10-CM

## 2023-02-22 NOTE — Therapy (Signed)
OUTPATIENT OCCUPATIONAL THERAPY ORTHO TREATMENT NOTE  Patient Name: Stephanie Francis MRN: [id number] DOB:Aug 22, 1966, 57 y.o., female Today's Date: 02/22/2023  PCP: Dr. Maurine Minister REFERRING PROVIDER: Dr. Cristopher Peru  END OF SESSION:  OT End of Session - 02/22/23 2133     Visit Number 14    Number of Visits 23    Date for OT Re-Evaluation 03/25/23    Authorization Time Period Reporting period beginning 02/07/23    Progress Note Due on Visit 10    OT Start Time 1645    OT Stop Time 1730    OT Time Calculation (min) 45 min    Equipment Utilized During Treatment none    Activity Tolerance Patient tolerated treatment well    Behavior During Therapy [facility] for tasks assessed/performed             No past medical history on file. No past surgical history on file. There are no problems to display for this patient.  ONSET DATE: October 2023  REFERRING DIAG: Trigger Finger of L index finger  THERAPY DIAG:  Muscle weakness (generalized)  Left hand paresthesia  Pain in left finger(s)  Rationale for Evaluation and Treatment: Rehabilitation  SUBJECTIVE:  SUBJECTIVE STATEMENT: Pt reports she will have her EMG next week for the LUE. 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  Per chart/Dr. Sherryll Burger: Left hand paresthesia in a patient with cervical radiculopathy, some component of carpal tunnel syndrome, weakness - concerning for central component, possible cubital tunnel syndrome component. - Negative ESR, C Reactive Protein, ANA, Rheumatoid Arthritis Factor - Recommend NCS upper extremity MRI C-Spine without contrast - we will do Occupational Therapy consult Left hand trigger finger - ortho consult  PRECAUTIONS: None  WEIGHT BEARING RESTRICTIONS: No  PAIN: 02/21/23: Pt reports 0/10 L elbow, L hand 1-2/10 pain Are you having pain? Yes: NPRS scale:  intermittently when  throbbing 7/10 Pain location: L elbow, base of L IF palmar aspect Pain description: throbbing, tender Aggravating factors: gripping/lifting/carrying heavy objects Relieving factors: rest/avoiding use of the L hand  LIVING ENVIRONMENT: Lives with: lives alone  PLOF: Independent, works as an Environmental health practitioner for [employer]   PATIENT GOALS: "To avoid surgery and not to hurt."  NEXT MD VISIT: Return to Dr. Sherryll Burger June 13th   OBJECTIVE:   HAND DOMINANCE: Right   ADLs: Overall ADLs: indep but some pain in the L hand with gripping/pinching  FUNCTIONAL OUTCOME MEASURES: FOTO: 61; 69 01/28/23: 67 02/11/23: 67  UPPER EXTREMITY ROM:     Active ROM Right eval Left eval Right 01/28/23 Left  01/28/23  Elbow flexion   WNL WNL  Elbow extension   WNL WNL  Wrist flexion 74 66  70  Wrist extension 70 71  70  Wrist ulnar deviation      Wrist radial deviation      Wrist pronation   90 90  Wrist supination   64 70  (Blank rows = not tested)  Active ROM Right eval Left eval Right 01/28/23 Left 01/28/23  Elbow flexion   5 5  Elbow extension   5 5  Wrist flexion   5 4  Wrist extension   5 4  Thumb MCP (0-60)      Thumb IP (0-80)      Thumb Radial abd/add (0-55)       Thumb Palmar abd/add (0-45)  Thumb Opposition to Small Finger       Index MCP (0-90)  35   60  Index PIP (0-100)   95  70 (achy)  Index DIP (0-70)   50  50  Long MCP (0-90)        Long PIP (0-100)        Long DIP (0-70)        Ring MCP (0-90)        Ring PIP (0-100)        Ring DIP (0-70)        Little MCP (0-90)        Little PIP (0-100)        Little DIP (0-70)        (Blank rows = not tested)    *Eval:L IF 4 cm from [facility] 01/10/23: L IF 2 cm to [facility] 01/28/23: L IF 2 cm [facility]  02/07/23: L IF 1.5 cm to [facility]  HAND FUNCTION: Grip strength: Right: 47 lbs; Left: 23 lbs, Lateral pinch: Right: 17 lbs, Left: 13 lbs, 3 point pinch: Right: 17 lbs, Left: 13 lbs, and Tip pinch: Right 9 lbs, Left: 4 lbs 01/10/23: L grip 38  lbs 01/28/23: R grip 46 lbs, L grip 34 lbs; lateral pinch: 17 lbs, 3 point pinch left: 15 lbs,  02/11/23: L grip 40, lateral pinch: 16 lbs, 3 point pinch left: 10 lbs  COORDINATION: 9 Hole Peg test: Right: 24 sec; Left: 24 sec  SENSATION: Pt reports numbness in L ulnar nerve distribution  for years; pt reports a neck injury 10+ years ago  01/28/23: L arm numbness constantly; concentrated at L elbow and distally to the hand along ulnar nerve distribution.  Pt reports she frequently drops items in L hand d/t lack of sensation; difficulty putting an earring in d/t lack of sensation.    EDEMA: Mild L volar palm  COGNITION: Overall cognitive status: Within functional limits for tasks assessed  OBSERVATIONS:  Unable to make composite fist on the L hand d/t stiffness in the IF.  Pain and weakness in the L hand limiting efficient use of the L hand with daily tasks.    TODAY'S TREATMENT:                                                                                                                              Paraffin:  X10 min for L hand pain management/muscle relaxation in prep for therapeutic exercises.  Iontophoresis:  20 min tx to L cubital tunnel region; 2cc dexamethasone, 40 mA, 2.0 current; skin check performed following tx; intact/good tolerance.  Manual Therapy: Soft tissue massage performed at the base of the L IF MCP volar palm, and PIP joint of same digit, working to increase circulation, decrease pain, decrease swelling related to trigger finger pain in this digit. Soft tissue massage completed at the L proximal dorsal elbow, working to increase circulation and decrease pain at and around cubital tunnel  region.   Therapeutic Exercise: Performed passive stretch for L hand fingers ext/abd, passive composite fist and isolated L IF MP, PIP, DIP flex/ext stretch.     PATIENT EDUCATION: Education details: wearing schedule for L hand compression glove (open fingered) Person educated:  Patient Education method: explanation, vc Education comprehension: verbalized understanding  HOME EXERCISE PROGRAM: Ice massage, contrast bath, passive stretching for L hand digits (flex/ext/abd), theraputty, tendon gliding  GOALS: Goals reviewed with patient? Yes   SHORT TERM GOALS: Target date: 02/25/23 (4 weeks)  Pt will be indep to perform HEP for improving LUE flexibility and strength for daily tasks (revised on 01/28/23 to include elbow/wrist/hand). Baseline: Eval: initiated ROM exercises; 01/28/23: Pt reports inconsistent stretching over the last couple of weeks; inconsistent use of theraputty; will plan to add ulnar nerve glides Goal status: ongoing  2.  Pt will be indep to verbalize 2-3 joint protection/cumulative trauma prevention strategies to reduce pain/paresthesias in the L hand.  Baseline: Eval: initiated educ, further training needed; 01/28/23: indep with joint protection/cumulative trauma prevention strategies Goal status: achieved  LONG TERM GOALS: Target date: 03/25/23 (8 weeks)  Pt will increase FOTO score to 69 or better to indicate improvement in self perceived functional use of the L hand with daily tasks.  Baseline: Eval: 61; 01/28/23: 67; 02/11/23: 67 Goal status: achieved  2.  Pt will increase L grip strength by 15 or more lbs in order to improve ability to hold and carry ADL supplies (revised 01/28/23).   Baseline: L grip 23 lbs (R 47 lbs); 01/28/23: L grip 34 lbs (R 46 lbs); 02/11/23: L grip 35 lbs Goal status: achieved/revised/ongoing  3.  Pt will increase L IF flexibility to enable pt to make a full composite fist to store ADL supplies without dropping. Baseline: Eval: Unable to make composite fist in L hand; L IF 4 cm from [facility]; 01/28/23: L IF 2 cm from [facility] Goal status: ongoing  4.  Pt will tolerate manual therapy, therapeutic modalities, exercises, and splinting as needed to decrease pain in L hand and elbow to a reported 3/10 pain or less with activity.   Baseline:  Eval: L hand pain 7/10 pain with activity (throbs); 01/28/23: 0/10 pain in L hand at rest, tender 2-3/pain with touch (base of L IF, palmar aspect), 3/10 pain in L elbow at rest, can reach 8/10 with activity Goal status: revised 01/28/23 to include elbow   5. With use of compensatory strategies, pt will be able to independently and efficiently clasp an earring in her L ear.  Baseline: Pt reports that with the numbness from her L elbow to the hand, clasping an earring in L ear take extensive time and trials.  Goal status: New  ASSESSMENT: CLINICAL IMPRESSION: Pt continues to tolerate iontophoresis treatments well and completed her 6th ionto tx this date.  L hand IF and palm at base of IF remain tender and tight.  Pt tolerated therapeutic exercises and manual therapy well.  Issued compression glove (open fingered) as pt reports compression to L hand feels good with the paresthesias in the L hand.  Instructed to don as needed during the day and remove for night time.  Good fit and comfort noted.  Pt will benefit from continued skilled OT to progress HEP, provide modalities for pain management including iontophoresis at the L elbow, and manual therapy for pain management in order to increase tolerance and functional use of the LUE with daily tasks, specifically being able to manipulate small items and  carry items in L hand without dropping.    PERFORMANCE DEFICITS: in functional skills including ADLs, IADLs, coordination, dexterity, edema, ROM, strength, pain, flexibility, Fine motor control, decreased knowledge of precautions, decreased knowledge of use of DME, and UE functional use. IMPAIRMENTS: are limiting patient from ADLs, IADLs, work, and leisure.   COMORBIDITIES: has co-morbidities such as hx of tumor resection in a R sided meningioma, LUE parasthesias from possible cubital tunnel and cervical radiculopathy  that affects occupational performance. Patient will benefit from skilled OT to address above  impairments and improve overall function.  MODIFICATION OR ASSISTANCE TO COMPLETE EVALUATION: No modification of tasks or assist necessary to complete an evaluation.  OT OCCUPATIONAL PROFILE AND HISTORY: Problem focused assessment: Including review of records relating to presenting problem.  CLINICAL DECISION MAKING: Moderate - several treatment options, min-mod task modification necessary  REHAB POTENTIAL: Good  EVALUATION COMPLEXITY: Moderate      PLAN:  OT FREQUENCY: 2x/week  OT DURATION: 8 weeks  PLANNED INTERVENTIONS: self care/ADL training, therapeutic exercise, therapeutic activity, neuromuscular re-education, manual therapy, passive range of motion, splinting, ultrasound, iontophoresis, paraffin, moist heat, cryotherapy, contrast bath, patient/family education, and DME and/or AE instructions  RECOMMENDED OTHER SERVICES: None at this time  CONSULTED AND AGREED WITH PLAN OF CARE: Patient  PLAN FOR NEXT SESSION: see above  Danelle Earthly, MS, OTR/L  Otis Dials, OT 02/22/2023, 9:35 PM

## 2023-02-25 ENCOUNTER — Ambulatory Visit: Payer: BC Managed Care – PPO

## 2023-02-27 ENCOUNTER — Ambulatory Visit: Payer: BC Managed Care – PPO | Attending: Neurology

## 2023-02-27 DIAGNOSIS — M6281 Muscle weakness (generalized): Secondary | ICD-10-CM | POA: Diagnosis present

## 2023-02-27 DIAGNOSIS — R202 Paresthesia of skin: Secondary | ICD-10-CM | POA: Insufficient documentation

## 2023-02-27 DIAGNOSIS — M65322 Trigger finger, left index finger: Secondary | ICD-10-CM | POA: Diagnosis present

## 2023-02-27 DIAGNOSIS — M79645 Pain in left finger(s): Secondary | ICD-10-CM | POA: Insufficient documentation

## 2023-02-28 ENCOUNTER — Ambulatory Visit
Admission: RE | Admit: 2023-02-28 | Discharge: 2023-02-28 | Disposition: A | Discharge status: Home / Self Care | Payer: BC Managed Care – PPO | Source: Ambulatory Visit | Attending: Physician Assistant | Admitting: Physician Assistant

## 2023-02-28 DIAGNOSIS — Z1231 Encounter for screening mammogram for malignant neoplasm of breast: Secondary | ICD-10-CM | POA: Insufficient documentation

## 2023-02-28 NOTE — Therapy (Addendum)
OUTPATIENT OCCUPATIONAL THERAPY ORTHO TREATMENT NOTE  Patient Name: Stephanie Francis MRN: [id number] DOB:Feb 23, 1966, 57 y.o., female Today's Date: 02/28/2023  PCP: Dr. Maurine Minister REFERRING PROVIDER: Dr. Cristopher Peru  END OF SESSION:  OT End of Session - 02/27/23 1614     Visit Number 15    Number of Visits 23    Date for OT Re-Evaluation 03/25/23    Authorization Time Period Reporting period beginning 02/07/23    Progress Note Due on Visit 10    OT Start Time 1600    OT Stop Time 1645    OT Time Calculation (min) 45 min    Equipment Utilized During Treatment none    Activity Tolerance Patient tolerated treatment well    Behavior During Therapy [facility] for tasks assessed/performed            No past medical history on file. No past surgical history on file. There are no problems to display for this patient.  ONSET DATE: October 2023  REFERRING DIAG: Trigger Finger of L index finger  THERAPY DIAG:  Muscle weakness (generalized)  Left hand paresthesia  Trigger index finger of left hand  Rationale for Evaluation and Treatment: Rehabilitation  SUBJECTIVE:  SUBJECTIVE STATEMENT: Pt requested to hold off on iontophoresis this date d/t some soreness from her EMG yesterday. 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  Per chart/Dr. Sherryll Burger: Left hand paresthesia in a patient with cervical radiculopathy, some component of carpal tunnel syndrome, weakness - concerning for central component, possible cubital tunnel syndrome component. - Negative ESR, C Reactive Protein, ANA, Rheumatoid Arthritis Factor - Recommend NCS upper extremity MRI C-Spine without contrast - we will do Occupational Therapy consult Left hand trigger finger - ortho consult  PRECAUTIONS: None  WEIGHT BEARING RESTRICTIONS: No  PAIN: 02/27/23: Pt reports 0/10 L elbow, L hand 1-2/10 pain Are you  having pain? Yes: NPRS scale:  intermittently when throbbing 7/10 Pain location: L elbow, base of L IF palmar aspect Pain description: throbbing, tender Aggravating factors: gripping/lifting/carrying heavy objects Relieving factors: rest/avoiding use of the L hand  LIVING ENVIRONMENT: Lives with: lives alone  PLOF: Independent, works as an Environmental health practitioner for [employer]   PATIENT GOALS: "To avoid surgery and not to hurt."  NEXT MD VISIT: Return to Dr. Sherryll Burger June 13th   OBJECTIVE:   HAND DOMINANCE: Right   ADLs: Overall ADLs: indep but some pain in the L hand with gripping/pinching  FUNCTIONAL OUTCOME MEASURES: FOTO: 61; 69 01/28/23: 67 02/11/23: 67  UPPER EXTREMITY ROM:     Active ROM Right eval Left eval Right 01/28/23 Left  01/28/23  Elbow flexion   WNL WNL  Elbow extension   WNL WNL  Wrist flexion 74 66  70  Wrist extension 70 71  70  Wrist ulnar deviation      Wrist radial deviation      Wrist pronation   90 90  Wrist supination   64 70  (Blank rows = not tested)  Active ROM Right eval Left eval Right 01/28/23 Left 01/28/23  Elbow flexion   5 5  Elbow extension   5 5  Wrist flexion   5 4  Wrist extension   5 4  Thumb MCP (0-60)      Thumb IP (0-80)      Thumb Radial abd/add (0-55)       Thumb Palmar abd/add (  0-45)       Thumb Opposition to Small Finger       Index MCP (0-90)  35   60  Index PIP (0-100)   95  70 (achy)  Index DIP (0-70)   50  50  Long MCP (0-90)        Long PIP (0-100)        Long DIP (0-70)        Ring MCP (0-90)        Ring PIP (0-100)        Ring DIP (0-70)        Little MCP (0-90)        Little PIP (0-100)        Little DIP (0-70)        (Blank rows = not tested)    *Eval:L IF 4 cm from [facility] 01/10/23: L IF 2 cm to [facility] 01/28/23: L IF 2 cm [facility]  02/07/23: L IF 1.5 cm to [facility]  HAND FUNCTION: Grip strength: Right: 47 lbs; Left: 23 lbs, Lateral pinch: Right: 17 lbs, Left: 13 lbs, 3 point pinch: Right: 17 lbs, Left: 13 lbs, and Tip  pinch: Right 9 lbs, Left: 4 lbs 01/10/23: L grip 38 lbs 01/28/23: R grip 46 lbs, L grip 34 lbs; lateral pinch: 17 lbs, 3 point pinch left: 15 lbs,  02/11/23: L grip 40, lateral pinch: 16 lbs, 3 point pinch left: 10 lbs  COORDINATION: 9 Hole Peg test: Right: 24 sec; Left: 24 sec  SENSATION: Pt reports numbness in L ulnar nerve distribution  for years; pt reports a neck injury 10+ years ago  01/28/23: L arm numbness constantly; concentrated at L elbow and distally to the hand along ulnar nerve distribution.  Pt reports she frequently drops items in L hand d/t lack of sensation; difficulty putting an earring in d/t lack of sensation.    EDEMA: Mild L volar palm  COGNITION: Overall cognitive status: Within functional limits for tasks assessed  OBSERVATIONS:  Unable to make composite fist on the L hand d/t stiffness in the IF.  Pain and weakness in the L hand limiting efficient use of the L hand with daily tasks.    TODAY'S TREATMENT:                                                                                                                              Paraffin:  X10 min for L hand pain management/muscle relaxation in prep for therapeutic exercises.  Manual Therapy: Soft tissue massage performed at the base of the L IF MCP volar palm, and PIP joint of same digit, working to increase circulation, decrease pain, decrease swelling related to trigger finger pain in this digit. Soft tissue massage completed at the L proximal dorsal elbow, working to increase circulation and decrease pain at and around cubital tunnel region.   Therapeutic Exercise: Performed passive stretch for L hand fingers ext/abd, passive composite fist and  isolated L IF MP, PIP, DIP flex/ext stretch.  Completed L IF A1 pulley exercise, performing isometric contraction at the DIP joint with MP positioned into 30-45 degrees flexion x3 sets 10 reps each.  PATIENT EDUCATION: Education details: LUE HEP review Person educated:  Patient Education method: explanation, vc Education comprehension: verbalized understanding  HOME EXERCISE PROGRAM: Ice massage, contrast bath, passive stretching for L hand digits (flex/ext/abd), theraputty, tendon gliding  GOALS: Goals reviewed with patient? Yes   SHORT TERM GOALS: Target date: 02/25/23 (4 weeks)  Pt will be indep to perform HEP for improving LUE flexibility and strength for daily tasks (revised on 01/28/23 to include elbow/wrist/hand). Baseline: Eval: initiated ROM exercises; 01/28/23: Pt reports inconsistent stretching over the last couple of weeks; inconsistent use of theraputty; will plan to add ulnar nerve glides Goal status: ongoing  2.  Pt will be indep to verbalize 2-3 joint protection/cumulative trauma prevention strategies to reduce pain/paresthesias in the L hand.  Baseline: Eval: initiated educ, further training needed; 01/28/23: indep with joint protection/cumulative trauma prevention strategies Goal status: achieved  LONG TERM GOALS: Target date: 03/25/23 (8 weeks)  Pt will increase FOTO score to 69 or better to indicate improvement in self perceived functional use of the L hand with daily tasks.  Baseline: Eval: 61; 01/28/23: 67; 02/11/23: 67 Goal status: achieved  2.  Pt will increase L grip strength by 15 or more lbs in order to improve ability to hold and carry ADL supplies (revised 01/28/23).   Baseline: L grip 23 lbs (R 47 lbs); 01/28/23: L grip 34 lbs (R 46 lbs); 02/11/23: L grip 35 lbs Goal status: achieved/revised/ongoing  3.  Pt will increase L IF flexibility to enable pt to make a full composite fist to store ADL supplies without dropping. Baseline: Eval: Unable to make composite fist in L hand; L IF 4 cm from [facility]; 01/28/23: L IF 2 cm from [facility] Goal status: ongoing  4.  Pt will tolerate manual therapy, therapeutic modalities, exercises, and splinting as needed to decrease pain in L hand and elbow to a reported 3/10 pain or less with activity.   Baseline:  Eval: L hand pain 7/10 pain with activity (throbs); 01/28/23: 0/10 pain in L hand at rest, tender 2-3/pain with touch (base of L IF, palmar aspect), 3/10 pain in L elbow at rest, can reach 8/10 with activity Goal status: revised 01/28/23 to include elbow   5. With use of compensatory strategies, pt will be able to independently and efficiently clasp an earring in her L ear.  Baseline: Pt reports that with the numbness from her L elbow to the hand, clasping an earring in L ear take extensive time and trials.  Goal status: New  ASSESSMENT: CLINICAL IMPRESSION: Pt requested to hold off on iontophoresis this date d/t some soreness from her EMG yesterday.  Pt does not yet have results of the EMG but will return for follow up to Dr. Sherryll Burger next week.  Pt asked if iontophoresis could be placed at palm, but pt will need a new order for this as orders are currently for iontophoresis to the L elbow.  Will plan 2-4 more treatments of ionto to L elbow, and pt may obtain order from Dr. Sherryll Burger next week if he would like ionto to the palm (A1 pulley base of IF for trigger finger).  Pt acknowledges inconsistent participation in HEP and inconsistent use of oval 8 splint.  Will anticipate d/c after iontophoresis treatments have been completed to the L  elbow, and/or the L palm.    PERFORMANCE DEFICITS: in functional skills including ADLs, IADLs, coordination, dexterity, edema, ROM, strength, pain, flexibility, Fine motor control, decreased knowledge of precautions, decreased knowledge of use of DME, and UE functional use. IMPAIRMENTS: are limiting patient from ADLs, IADLs, work, and leisure.   COMORBIDITIES: has co-morbidities such as hx of tumor resection in a R sided meningioma, LUE parasthesias from possible cubital tunnel and cervical radiculopathy  that affects occupational performance. Patient will benefit from skilled OT to address above impairments and improve overall function.  MODIFICATION OR ASSISTANCE TO COMPLETE  EVALUATION: No modification of tasks or assist necessary to complete an evaluation.  OT OCCUPATIONAL PROFILE AND HISTORY: Problem focused assessment: Including review of records relating to presenting problem.  CLINICAL DECISION MAKING: Moderate - several treatment options, min-mod task modification necessary  REHAB POTENTIAL: Good  EVALUATION COMPLEXITY: Moderate      PLAN:  OT FREQUENCY: 2x/week  OT DURATION: 8 weeks  PLANNED INTERVENTIONS: self care/ADL training, therapeutic exercise, therapeutic activity, neuromuscular re-education, manual therapy, passive range of motion, splinting, ultrasound, iontophoresis, paraffin, moist heat, cryotherapy, contrast bath, patient/family education, and DME and/or AE instructions  RECOMMENDED OTHER SERVICES: None at this time  CONSULTED AND AGREED WITH PLAN OF CARE: Patient  PLAN FOR NEXT SESSION: see above  Danelle Earthly, MS, OTR/L  Otis Dials, OT 02/28/2023, 8:26 AM

## 2023-03-04 ENCOUNTER — Ambulatory Visit: Payer: BC Managed Care – PPO

## 2023-03-04 DIAGNOSIS — M79645 Pain in left finger(s): Secondary | ICD-10-CM

## 2023-03-04 DIAGNOSIS — M65322 Trigger finger, left index finger: Secondary | ICD-10-CM

## 2023-03-04 DIAGNOSIS — R202 Paresthesia of skin: Secondary | ICD-10-CM

## 2023-03-04 DIAGNOSIS — M6281 Muscle weakness (generalized): Secondary | ICD-10-CM | POA: Diagnosis not present

## 2023-03-05 NOTE — Therapy (Addendum)
OUTPATIENT OCCUPATIONAL THERAPY ORTHO TREATMENT NOTE  Patient Name: Stephanie Francis MRN: [id number] DOB:01/25/1966, 57 y.o., female Today's Date: 03/12/2023  PCP: Dr. Maurine Minister REFERRING PROVIDER: Dr. Cristopher Peru  END OF SESSION:   No past medical history on file. No past surgical history on file. There are no problems to display for this patient.  ONSET DATE: October 2023  REFERRING DIAG: Trigger Finger of L index finger  THERAPY DIAG:  Left hand paresthesia  Trigger index finger of left hand  Pain in left finger(s)  Rationale for Evaluation and Treatment: Rehabilitation  SUBJECTIVE:  SUBJECTIVE STATEMENT: Pt reported she has poison ivy patches on her L arm which she got over the weekend, so no paraffin bath completed today as patches were present on her L wrist and hand.  Therapist donned gloves for tx session this date. 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  Per chart/Dr. Sherryll Burger: Left hand paresthesia in a patient with cervical radiculopathy, some component of carpal tunnel syndrome, weakness - concerning for central component, possible cubital tunnel syndrome component. - Negative ESR, C Reactive Protein, ANA, Rheumatoid Arthritis Factor - Recommend NCS upper extremity MRI C-Spine without contrast - we will do Occupational Therapy consult Left hand trigger finger - ortho consult  PRECAUTIONS: None  WEIGHT BEARING RESTRICTIONS: No  PAIN: 03/04/23: Pt reports 0/10 L elbow, L hand 1-2/10 pain Are you having pain? Yes: NPRS scale:  intermittently when throbbing 7/10 Pain location: L elbow, base of L IF palmar aspect Pain description: throbbing, tender Aggravating factors: gripping/lifting/carrying heavy objects Relieving factors: rest/avoiding use of the L hand  LIVING ENVIRONMENT: Lives with: lives alone  PLOF: Independent, works as an  Environmental health practitioner for [employer]   PATIENT GOALS: "To avoid surgery and not to hurt."  NEXT MD VISIT: Return to Dr. Sherryll Burger June 13th   OBJECTIVE:   HAND DOMINANCE: Right   ADLs: Overall ADLs: indep but some pain in the L hand with gripping/pinching  FUNCTIONAL OUTCOME MEASURES: FOTO: 61; 69 01/28/23: 67 02/11/23: 67  UPPER EXTREMITY ROM:     Active ROM Right eval Left eval Right 01/28/23 Left  01/28/23  Elbow flexion   WNL WNL  Elbow extension   WNL WNL  Wrist flexion 74 66  70  Wrist extension 70 71  70  Wrist ulnar deviation      Wrist radial deviation      Wrist pronation   90 90  Wrist supination   64 70  (Blank rows = not tested)  Active ROM Right eval Left eval Right 01/28/23 Left 01/28/23  Elbow flexion   5 5  Elbow extension   5 5  Wrist flexion   5 4  Wrist extension   5 4  Thumb MCP (0-60)      Thumb IP (0-80)      Thumb Radial abd/add (0-55)       Thumb Palmar abd/add (0-45)       Thumb Opposition to Small Finger       Index MCP (0-90)  35   60  Index PIP (0-100)   95  70 (achy)  Index DIP (0-70)   50  50  Long MCP (0-90)        Long PIP (0-100)        Long DIP (0-70)        Ring MCP (0-90)  Ring PIP (0-100)        Ring DIP (0-70)        Little MCP (0-90)        Little PIP (0-100)        Little DIP (0-70)        (Blank rows = not tested)    *Eval:L IF 4 cm from [facility] 01/10/23: L IF 2 cm to [facility] 01/28/23: L IF 2 cm [facility]  02/07/23: L IF 1.5 cm to [facility]  HAND FUNCTION: Grip strength: Right: 47 lbs; Left: 23 lbs, Lateral pinch: Right: 17 lbs, Left: 13 lbs, 3 point pinch: Right: 17 lbs, Left: 13 lbs, and Tip pinch: Right 9 lbs, Left: 4 lbs 01/10/23: L grip 38 lbs 01/28/23: R grip 46 lbs, L grip 34 lbs; lateral pinch: 17 lbs, 3 point pinch left: 15 lbs,  02/11/23: L grip 40, lateral pinch: 16 lbs, 3 point pinch left: 10 lbs  COORDINATION: 9 Hole Peg test: Right: 24 sec; Left: 24 sec  SENSATION: Pt reports numbness in L ulnar nerve distribution  for  years; pt reports a neck injury 10+ years ago  01/28/23: L arm numbness constantly; concentrated at L elbow and distally to the hand along ulnar nerve distribution.  Pt reports she frequently drops items in L hand d/t lack of sensation; difficulty putting an earring in d/t lack of sensation.    EDEMA: Mild L volar palm  COGNITION: Overall cognitive status: Within functional limits for tasks assessed  OBSERVATIONS:  Unable to make composite fist on the L hand d/t stiffness in the IF.  Pain and weakness in the L hand limiting efficient use of the L hand with daily tasks.    TODAY'S TREATMENT:                                                                                                                              Contrast Bath: 20 min; 4 cycles alternating moist heat 4 min, and cold pack 1 min to L hand for pain/edema reduction in the L hand.  Good tolerance.  Iontophoresis:  20 min tx to L cubital tunnel region; 2cc dexamethasone, 40 mA, 2.0 current; skin check performed following tx; intact/good tolerance.  Manual Therapy: Soft tissue massage performed at the base of the L IF MCP volar palm, and PIP joint of same digit, working to increase circulation, decrease pain, decrease swelling related to trigger finger pain in this digit. Soft tissue massage completed at the L proximal dorsal elbow, working to increase circulation and decrease pain at and around cubital tunnel region.    PATIENT EDUCATION: Education details: LUE HEP review Person educated: Patient Education method: explanation, vc Education comprehension: verbalized understanding  HOME EXERCISE PROGRAM: Ice massage, contrast bath, passive stretching for L hand digits (flex/ext/abd), theraputty, tendon gliding  GOALS: Goals reviewed with patient? Yes   SHORT TERM GOALS: Target date: 02/25/23 (4 weeks)  Pt will be indep to perform HEP  for improving LUE flexibility and strength for daily tasks (revised on 01/28/23 to include  elbow/wrist/hand). Baseline: Eval: initiated ROM exercises; 01/28/23: Pt reports inconsistent stretching over the last couple of weeks; inconsistent use of theraputty; will plan to add ulnar nerve glides Goal status: ongoing  2.  Pt will be indep to verbalize 2-3 joint protection/cumulative trauma prevention strategies to reduce pain/paresthesias in the L hand.  Baseline: Eval: initiated educ, further training needed; 01/28/23: indep with joint protection/cumulative trauma prevention strategies Goal status: achieved  LONG TERM GOALS: Target date: 03/25/23 (8 weeks)  Pt will increase FOTO score to 69 or better to indicate improvement in self perceived functional use of the L hand with daily tasks.  Baseline: Eval: 61; 01/28/23: 67; 02/11/23: 67 Goal status: achieved  2.  Pt will increase L grip strength by 15 or more lbs in order to improve ability to hold and carry ADL supplies (revised 01/28/23).   Baseline: L grip 23 lbs (R 47 lbs); 01/28/23: L grip 34 lbs (R 46 lbs); 02/11/23: L grip 35 lbs Goal status: achieved/revised/ongoing  3.  Pt will increase L IF flexibility to enable pt to make a full composite fist to store ADL supplies without dropping. Baseline: Eval: Unable to make composite fist in L hand; L IF 4 cm from [facility]; 01/28/23: L IF 2 cm from [facility] Goal status: ongoing  4.  Pt will tolerate manual therapy, therapeutic modalities, exercises, and splinting as needed to decrease pain in L hand and elbow to a reported 3/10 pain or less with activity.   Baseline: Eval: L hand pain 7/10 pain with activity (throbs); 01/28/23: 0/10 pain in L hand at rest, tender 2-3/pain with touch (base of L IF, palmar aspect), 3/10 pain in L elbow at rest, can reach 8/10 with activity Goal status: revised 01/28/23 to include elbow   5. With use of compensatory strategies, pt will be able to independently and efficiently clasp an earring in her L ear.  Baseline: Pt reports that with the numbness from her L elbow to the hand,  clasping an earring in L ear take extensive time and trials.  Goal status: New  ASSESSMENT: CLINICAL IMPRESSION: Pt reported she has poison ivy patches on her L arm which she got over the weekend, so no paraffin bath completed today as patches were present on her L wrist and hand.  Completed contrast bath instead for L hand pain/edema reduction with good tolerance.  Good tolerance to iontophoresis L elbow; skin intact following tx.  HEP reviewed, including encouragement to complete contrast bath and/or ice massage, and soft tissue massage at home for pain/edema reduction.  Pt verbalized understanding.  Will anticipate d/c after iontophoresis treatments have been completed to the L elbow, and/or the L palm.    PERFORMANCE DEFICITS: in functional skills including ADLs, IADLs, coordination, dexterity, edema, ROM, strength, pain, flexibility, Fine motor control, decreased knowledge of precautions, decreased knowledge of use of DME, and UE functional use. IMPAIRMENTS: are limiting patient from ADLs, IADLs, work, and leisure.   COMORBIDITIES: has co-morbidities such as hx of tumor resection in a R sided meningioma, LUE parasthesias from possible cubital tunnel and cervical radiculopathy  that affects occupational performance. Patient will benefit from skilled OT to address above impairments and improve overall function.  MODIFICATION OR ASSISTANCE TO COMPLETE EVALUATION: No modification of tasks or assist necessary to complete an evaluation.  OT OCCUPATIONAL PROFILE AND HISTORY: Problem focused assessment: Including review of records relating to presenting problem.  CLINICAL  DECISION MAKING: Moderate - several treatment options, min-mod task modification necessary  REHAB POTENTIAL: Good  EVALUATION COMPLEXITY: Moderate      PLAN:  OT FREQUENCY: 2x/week  OT DURATION: 8 weeks  PLANNED INTERVENTIONS: self care/ADL training, therapeutic exercise, therapeutic activity, neuromuscular  re-education, manual therapy, passive range of motion, splinting, ultrasound, iontophoresis, paraffin, moist heat, cryotherapy, contrast bath, patient/family education, and DME and/or AE instructions  RECOMMENDED OTHER SERVICES: None at this time  CONSULTED AND AGREED WITH PLAN OF CARE: Patient  PLAN FOR NEXT SESSION: see above  Danelle Earthly, MS, OTR/L  Otis Dials, OT 03/12/2023, 12:00 PM

## 2023-03-06 ENCOUNTER — Ambulatory Visit: Payer: BC Managed Care – PPO

## 2023-03-06 DIAGNOSIS — M6281 Muscle weakness (generalized): Secondary | ICD-10-CM | POA: Diagnosis not present

## 2023-03-06 DIAGNOSIS — M79645 Pain in left finger(s): Secondary | ICD-10-CM

## 2023-03-06 DIAGNOSIS — M65322 Trigger finger, left index finger: Secondary | ICD-10-CM

## 2023-03-06 DIAGNOSIS — R202 Paresthesia of skin: Secondary | ICD-10-CM

## 2023-03-08 NOTE — Therapy (Signed)
OUTPATIENT OCCUPATIONAL THERAPY ORTHO TREATMENT NOTE  Patient Name: Stephanie Francis MRN: [id number] DOB:01-11-1966, 57 y.o., female Today's Date: 03/08/2023  PCP: Dr. Maurine Minister REFERRING PROVIDER: Dr. Cristopher Peru  END OF SESSION:  OT End of Session - 03/08/23 0850     Visit Number 17    Number of Visits 23    Date for OT Re-Evaluation 03/25/23    Authorization Time Period Reporting period beginning 02/07/23    Progress Note Due on Visit 10    OT Start Time 1600    OT Stop Time 1650    OT Time Calculation (min) 50 min    Equipment Utilized During Treatment none    Activity Tolerance Patient tolerated treatment well    Behavior During Therapy [facility] for tasks assessed/performed            No past medical history on file. No past surgical history on file. There are no problems to display for this patient.  ONSET DATE: October 2023  REFERRING DIAG: Trigger Finger of L index finger  THERAPY DIAG:  Left hand paresthesia  Trigger index finger of left hand  Pain in left finger(s)  Rationale for Evaluation and Treatment: Rehabilitation  SUBJECTIVE:  SUBJECTIVE STATEMENT: Pt reports she'll go for her follow up with Dr. Sherryll Burger to discuss imaging results tomorrow. 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  Per chart/Dr. Sherryll Burger: Left hand paresthesia in a patient with cervical radiculopathy, some component of carpal tunnel syndrome, weakness - concerning for central component, possible cubital tunnel syndrome component. - Negative ESR, C Reactive Protein, ANA, Rheumatoid Arthritis Factor - Recommend NCS upper extremity MRI C-Spine without contrast - we will do Occupational Therapy consult Left hand trigger finger - ortho consult  PRECAUTIONS: None  WEIGHT BEARING RESTRICTIONS: No  PAIN: 03/06/23: Pt reports 0/10 L elbow, L hand 1-2/10 pain Are you having pain?  Yes: NPRS scale:  intermittently when throbbing 7/10 Pain location: L elbow, base of L IF palmar aspect Pain description: throbbing, tender Aggravating factors: gripping/lifting/carrying heavy objects Relieving factors: rest/avoiding use of the L hand  LIVING ENVIRONMENT: Lives with: lives alone  PLOF: Independent, works as an Environmental health practitioner for [employer]   PATIENT GOALS: "To avoid surgery and not to hurt."  NEXT MD VISIT: Return to Dr. Sherryll Burger June 13th   OBJECTIVE:   HAND DOMINANCE: Right   ADLs: Overall ADLs: indep but some pain in the L hand with gripping/pinching  FUNCTIONAL OUTCOME MEASURES: FOTO: 61; 69 01/28/23: 67 02/11/23: 67  UPPER EXTREMITY ROM:     Active ROM Right eval Left eval Right 01/28/23 Left  01/28/23  Elbow flexion   WNL WNL  Elbow extension   WNL WNL  Wrist flexion 74 66  70  Wrist extension 70 71  70  Wrist ulnar deviation      Wrist radial deviation      Wrist pronation   90 90  Wrist supination   64 70  (Blank rows = not tested)  Active ROM Right eval Left eval Right 01/28/23 Left 01/28/23  Elbow flexion   5 5  Elbow extension   5 5  Wrist flexion   5 4  Wrist extension   5 4  Thumb MCP (0-60)      Thumb IP (0-80)      Thumb Radial abd/add (0-55)       Thumb Palmar  abd/add (0-45)       Thumb Opposition to Small Finger       Index MCP (0-90)  35   60  Index PIP (0-100)   95  70 (achy)  Index DIP (0-70)   50  50  Long MCP (0-90)        Long PIP (0-100)        Long DIP (0-70)        Ring MCP (0-90)        Ring PIP (0-100)        Ring DIP (0-70)        Little MCP (0-90)        Little PIP (0-100)        Little DIP (0-70)        (Blank rows = not tested)    *Eval:L IF 4 cm from [facility] 01/10/23: L IF 2 cm to [facility] 01/28/23: L IF 2 cm [facility]  02/07/23: L IF 1.5 cm to [facility]  HAND FUNCTION: Grip strength: Right: 47 lbs; Left: 23 lbs, Lateral pinch: Right: 17 lbs, Left: 13 lbs, 3 point pinch: Right: 17 lbs, Left: 13 lbs, and Tip pinch: Right 9  lbs, Left: 4 lbs 01/10/23: L grip 38 lbs 01/28/23: R grip 46 lbs, L grip 34 lbs; lateral pinch: 17 lbs, 3 point pinch left: 15 lbs,  02/11/23: L grip 40, lateral pinch: 16 lbs, 3 point pinch left: 10 lbs  COORDINATION: 9 Hole Peg test: Right: 24 sec; Left: 24 sec  SENSATION: Pt reports numbness in L ulnar nerve distribution  for years; pt reports a neck injury 10+ years ago  01/28/23: L arm numbness constantly; concentrated at L elbow and distally to the hand along ulnar nerve distribution.  Pt reports she frequently drops items in L hand d/t lack of sensation; difficulty putting an earring in d/t lack of sensation.    EDEMA: Mild L volar palm  COGNITION: Overall cognitive status: Within functional limits for tasks assessed  OBSERVATIONS:  Unable to make composite fist on the L hand d/t stiffness in the IF.  Pain and weakness in the L hand limiting efficient use of the L hand with daily tasks.    TODAY'S TREATMENT:                                                                                                                              Contrast Bath: 20 min; 4 cycles alternating moist heat 4 min, and cold pack 1 min to L hand for pain/edema reduction in the L hand.  Good tolerance.  Iontophoresis:  20 min tx to L cubital tunnel region; 2cc dexamethasone, 40 mA, 2.0 current; skin check performed following tx; intact/good tolerance.  Manual Therapy: Soft tissue massage performed at the base of the L IF MCP volar palm, and PIP joint of same digit, working to increase circulation, decrease pain, decrease swelling related to trigger finger pain in this digit.  Soft tissue massage completed at the L proximal dorsal elbow, working to increase circulation and decrease pain at and around cubital tunnel region.   Simultaneous to manual therapy, performed passive stretching for L hand fingers for ext/abd, passive composite fist and isolated L IF MP, PIP, DIP flex/ext stretch.   PATIENT  EDUCATION: Education details: LUE HEP review Person educated: Patient Education method: explanation, vc Education comprehension: verbalized understanding  HOME EXERCISE PROGRAM: Ice massage, contrast bath, passive stretching for L hand digits (flex/ext/abd), theraputty, tendon gliding  GOALS: Goals reviewed with patient? Yes   SHORT TERM GOALS: Target date: 02/25/23 (4 weeks)  Pt will be indep to perform HEP for improving LUE flexibility and strength for daily tasks (revised on 01/28/23 to include elbow/wrist/hand). Baseline: Eval: initiated ROM exercises; 01/28/23: Pt reports inconsistent stretching over the last couple of weeks; inconsistent use of theraputty; will plan to add ulnar nerve glides Goal status: ongoing  2.  Pt will be indep to verbalize 2-3 joint protection/cumulative trauma prevention strategies to reduce pain/paresthesias in the L hand.  Baseline: Eval: initiated educ, further training needed; 01/28/23: indep with joint protection/cumulative trauma prevention strategies Goal status: achieved  LONG TERM GOALS: Target date: 03/25/23 (8 weeks)  Pt will increase FOTO score to 69 or better to indicate improvement in self perceived functional use of the L hand with daily tasks.  Baseline: Eval: 61; 01/28/23: 67; 02/11/23: 67 Goal status: achieved  2.  Pt will increase L grip strength by 15 or more lbs in order to improve ability to hold and carry ADL supplies (revised 01/28/23).   Baseline: L grip 23 lbs (R 47 lbs); 01/28/23: L grip 34 lbs (R 46 lbs); 02/11/23: L grip 35 lbs Goal status: achieved/revised/ongoing  3.  Pt will increase L IF flexibility to enable pt to make a full composite fist to store ADL supplies without dropping. Baseline: Eval: Unable to make composite fist in L hand; L IF 4 cm from [facility]; 01/28/23: L IF 2 cm from [facility] Goal status: ongoing  4.  Pt will tolerate manual therapy, therapeutic modalities, exercises, and splinting as needed to decrease pain in L hand and elbow  to a reported 3/10 pain or less with activity.   Baseline: Eval: L hand pain 7/10 pain with activity (throbs); 01/28/23: 0/10 pain in L hand at rest, tender 2-3/pain with touch (base of L IF, palmar aspect), 3/10 pain in L elbow at rest, can reach 8/10 with activity Goal status: revised 01/28/23 to include elbow   5. With use of compensatory strategies, pt will be able to independently and efficiently clasp an earring in her L ear.  Baseline: Pt reports that with the numbness from her L elbow to the hand, clasping an earring in L ear take extensive time and trials.  Goal status: New  ASSESSMENT: CLINICAL IMPRESSION: Avoided paraffin again at L hand d/t residual poison ivy rash patches on L hand/arm.  Pt reported the contrast bath had helped with pain last session so this was completed again to manage pain/edema with good tolerance.  Good tolerance to manual therapy and passive stretching this date for increasing flexibility in L hand digits.  Good tolerance to iontophoresis at L elbow when intensity is increased slowly from 1.5 to 2.0 within the first 1-2 min of tx.  Pt will see Dr. Sherryll Burger tomorrow for follow up on imaging results. Will anticipate d/c after iontophoresis treatments have been completed to the L elbow, and/or the L palm.  PERFORMANCE DEFICITS: in functional skills including ADLs, IADLs, coordination, dexterity, edema, ROM, strength, pain, flexibility, Fine motor control, decreased knowledge of precautions, decreased knowledge of use of DME, and UE functional use. IMPAIRMENTS: are limiting patient from ADLs, IADLs, work, and leisure.   COMORBIDITIES: has co-morbidities such as hx of tumor resection in a R sided meningioma, LUE parasthesias from possible cubital tunnel and cervical radiculopathy  that affects occupational performance. Patient will benefit from skilled OT to address above impairments and improve overall function.  MODIFICATION OR ASSISTANCE TO COMPLETE EVALUATION: No  modification of tasks or assist necessary to complete an evaluation.  OT OCCUPATIONAL PROFILE AND HISTORY: Problem focused assessment: Including review of records relating to presenting problem.  CLINICAL DECISION MAKING: Moderate - several treatment options, min-mod task modification necessary  REHAB POTENTIAL: Good  EVALUATION COMPLEXITY: Moderate    PLAN:  OT FREQUENCY: 2x/week  OT DURATION: 8 weeks  PLANNED INTERVENTIONS: self care/ADL training, therapeutic exercise, therapeutic activity, neuromuscular re-education, manual therapy, passive range of motion, splinting, ultrasound, iontophoresis, paraffin, moist heat, cryotherapy, contrast bath, patient/family education, and DME and/or AE instructions  RECOMMENDED OTHER SERVICES: None at this time  CONSULTED AND AGREED WITH PLAN OF CARE: Patient  PLAN FOR NEXT SESSION: see above  Danelle Earthly, MS, OTR/L  Otis Dials, OT 03/08/2023, 8:53 AM

## 2023-03-11 ENCOUNTER — Ambulatory Visit: Payer: BC Managed Care – PPO

## 2023-03-11 DIAGNOSIS — M65322 Trigger finger, left index finger: Secondary | ICD-10-CM

## 2023-03-11 DIAGNOSIS — M6281 Muscle weakness (generalized): Secondary | ICD-10-CM | POA: Diagnosis not present

## 2023-03-11 DIAGNOSIS — M79645 Pain in left finger(s): Secondary | ICD-10-CM

## 2023-03-11 DIAGNOSIS — R202 Paresthesia of skin: Secondary | ICD-10-CM

## 2023-03-11 NOTE — Therapy (Signed)
OUTPATIENT OCCUPATIONAL THERAPY ORTHO TREATMENT NOTE  Patient Name: Stephanie Francis MRN: [id number] DOB:05/31/1966, 57 y.o., female Today's Date: 03/11/2023  PCP: Dr. Maurine Minister REFERRING PROVIDER: Dr. Cristopher Peru  END OF SESSION:  OT End of Session - 03/11/23 1702     Visit Number 18    Number of Visits 23    Date for OT Re-Evaluation 03/25/23    Authorization Time Period Reporting period beginning 02/07/23    Progress Note Due on Visit 10    OT Start Time 1605    OT Stop Time 1650    OT Time Calculation (min) 45 min    Equipment Utilized During Treatment none    Activity Tolerance Patient tolerated treatment well    Behavior During Therapy [facility] for tasks assessed/performed            No past medical history on file. No past surgical history on file. There are no problems to display for this patient.  ONSET DATE: October 2023  REFERRING DIAG: Trigger Finger of L index finger  THERAPY DIAG:  Left hand paresthesia  Trigger index finger of left hand  Pain in left finger(s)  Rationale for Evaluation and Treatment: Rehabilitation  SUBJECTIVE:  SUBJECTIVE STATEMENT: Pt reports she went for her follow up with Dr. Sherryll Burger last week and he plans to refer her to neuro surgery, though pt is unsure she will pursue this.  OT encouraged pt keep the appointment to be advised on all of her options. 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  Per chart/Dr. Sherryll Burger: Left hand paresthesia in a patient with cervical radiculopathy, some component of carpal tunnel syndrome, weakness - concerning for central component, possible cubital tunnel syndrome component. - Negative ESR, C Reactive Protein, ANA, Rheumatoid Arthritis Factor - Recommend NCS upper extremity MRI C-Spine without contrast - we will do Occupational Therapy consult Left hand trigger finger - ortho  consult  PRECAUTIONS: None  WEIGHT BEARING RESTRICTIONS: No  PAIN: 03/06/23: Pt reports 0/10 L elbow, L hand 1-2/10 pain Are you having pain? Yes: NPRS scale:  intermittently when throbbing 7/10 Pain location: L elbow, base of L IF palmar aspect Pain description: throbbing, tender Aggravating factors: gripping/lifting/carrying heavy objects Relieving factors: rest/avoiding use of the L hand  LIVING ENVIRONMENT: Lives with: lives alone  PLOF: Independent, works as an Environmental health practitioner for [employer]   PATIENT GOALS: "To avoid surgery and not to hurt."  NEXT MD VISIT: Return to Dr. Sherryll Burger June 13th   OBJECTIVE:   HAND DOMINANCE: Right   ADLs: Overall ADLs: indep but some pain in the L hand with gripping/pinching  FUNCTIONAL OUTCOME MEASURES: FOTO: 61; 69 01/28/23: 67 02/11/23: 67  UPPER EXTREMITY ROM:     Active ROM Right eval Left eval Right 01/28/23 Left  01/28/23  Elbow flexion   WNL WNL  Elbow extension   WNL WNL  Wrist flexion 74 66  70  Wrist extension 70 71  70  Wrist ulnar deviation      Wrist radial deviation      Wrist pronation   90 90  Wrist supination   64 70  (Blank rows = not tested)  Active ROM Right eval Left eval Right 01/28/23 Left 01/28/23  Elbow flexion   5 5  Elbow extension   5 5  Wrist flexion   5 4  Wrist extension   5 4  Thumb MCP (0-60)      Thumb IP (0-80)      Thumb Radial abd/add (0-55)       Thumb Palmar abd/add (0-45)       Thumb Opposition to Small Finger       Index MCP (0-90)  35   60  Index PIP (0-100)   95  70 (achy)  Index DIP (0-70)   50  50  Long MCP (0-90)        Long PIP (0-100)        Long DIP (0-70)        Ring MCP (0-90)        Ring PIP (0-100)        Ring DIP (0-70)        Little MCP (0-90)        Little PIP (0-100)        Little DIP (0-70)        (Blank rows = not tested)    *Eval:L IF 4 cm from [facility] 01/10/23: L IF 2 cm to [facility] 01/28/23: L IF 2 cm [facility]  02/07/23: L IF 1.5 cm to [facility]  HAND FUNCTION: Grip  strength: Right: 47 lbs; Left: 23 lbs, Lateral pinch: Right: 17 lbs, Left: 13 lbs, 3 point pinch: Right: 17 lbs, Left: 13 lbs, and Tip pinch: Right 9 lbs, Left: 4 lbs 01/10/23: L grip 38 lbs 01/28/23: R grip 46 lbs, L grip 34 lbs; lateral pinch: 17 lbs, 3 point pinch left: 15 lbs,  02/11/23: L grip 40, lateral pinch: 16 lbs, 3 point pinch left: 10 lbs  COORDINATION: 9 Hole Peg test: Right: 24 sec; Left: 24 sec  SENSATION: Pt reports numbness in L ulnar nerve distribution  for years; pt reports a neck injury 10+ years ago  01/28/23: L arm numbness constantly; concentrated at L elbow and distally to the hand along ulnar nerve distribution.  Pt reports she frequently drops items in L hand d/t lack of sensation; difficulty putting an earring in d/t lack of sensation.    EDEMA: Mild L volar palm  COGNITION: Overall cognitive status: Within functional limits for tasks assessed  OBSERVATIONS:  Unable to make composite fist on the L hand d/t stiffness in the IF.  Pain and weakness in the L hand limiting efficient use of the L hand with daily tasks.    TODAY'S TREATMENT:                                                                                                                              Iontophoresis:  20 min tx to base of L palm at the volar aspect of the MCP of the IF/ A1 pulley to target pain from trigger finger; 2cc dexamethasone, 40 mA, 2.0 current; skin check performed following tx; intact/good tolerance.  Manual Therapy: Soft tissue massage performed at the base of the L IF MCP volar palm, and PIP joint of same digit,  working to increase circulation, decrease pain, decrease swelling related to trigger finger pain in this digit.   Self Care: Made recommendation to re-start consistent use of oval 8 splint on L IF PIP.  Encouraged pt wear at work, remove during bathroom breaks for hygiene and gentle passive flexion stretching to prevent stiffness.  Wear with heavy household tasks such as  during laundry/cleaning.  Reviewed joint protection/positioning/pain management for L elbow, including avoiding prolonged bending, icing as needed, avoid heavy lifting.  Reviewed goals and plan to shift away from further tx to the L elbow (stop iontophoresis for the L elbow) as pt's NCS confirmed numbness in the L elbow and hand was related to cervical spine.   PATIENT EDUCATION: Education details: poc changes Person educated: Patient Education method: explanation, vc Education comprehension: verbalized understanding  HOME EXERCISE PROGRAM: Ice massage, contrast bath, passive stretching for L hand digits (flex/ext/abd), theraputty, tendon gliding  GOALS: Goals reviewed with patient? Yes   SHORT TERM GOALS: Target date: 02/25/23 (4 weeks)  Pt will be indep to perform HEP for improving LUE flexibility and strength for daily tasks (revised on 01/28/23 to include elbow/wrist/hand). Baseline: Eval: initiated ROM exercises; 01/28/23: Pt reports inconsistent stretching over the last couple of weeks; inconsistent use of theraputty; will plan to add ulnar nerve glides Goal status: ongoing  2.  Pt will be indep to verbalize 2-3 joint protection/cumulative trauma prevention strategies to reduce pain/paresthesias in the L hand.  Baseline: Eval: initiated educ, further training needed; 01/28/23: indep with joint protection/cumulative trauma prevention strategies Goal status: achieved  LONG TERM GOALS: Target date: 03/25/23 (8 weeks)  Pt will increase FOTO score to 69 or better to indicate improvement in self perceived functional use of the L hand with daily tasks.  Baseline: Eval: 61; 01/28/23: 67; 02/11/23: 67 Goal status: achieved  2.  Pt will increase L grip strength by 15 or more lbs in order to improve ability to hold and carry ADL supplies (revised 01/28/23).   Baseline: L grip 23 lbs (R 47 lbs); 01/28/23: L grip 34 lbs (R 46 lbs); 02/11/23: L grip 35 lbs Goal status: achieved/revised/ongoing  3.  Pt will  increase L IF flexibility to enable pt to make a full composite fist to store ADL supplies without dropping. Baseline: Eval: Unable to make composite fist in L hand; L IF 4 cm from [facility]; 01/28/23: L IF 2 cm from [facility] Goal status: ongoing  4.  Pt will tolerate manual therapy, therapeutic modalities, exercises, and splinting as needed to decrease pain in L hand and elbow to a reported 3/10 pain or less with activity.   Baseline: Eval: L hand pain 7/10 pain with activity (throbs); 01/28/23: 0/10 pain in L hand at rest, tender 2-3/pain with touch (base of L IF, palmar aspect), 3/10 pain in L elbow at rest, can reach 8/10 with activity Goal status: revised 01/28/23 to include elbow   5. With use of compensatory strategies, pt will be able to independently and efficiently clasp an earring in her L ear.  Baseline: Pt reports that with the numbness from her L elbow to the hand, clasping an earring in L ear take extensive time and trials.  Goal status: New  ASSESSMENT: CLINICAL IMPRESSION: Avoided paraffin again at L hand d/t residual poison ivy rash patches on L hand/arm.  Transitioned away from iontophoresis at the L elbow today d/t NCS showing numbness in the elbow is originating from issues at the cervical spine per chart from pt's follow  up with Dr. Sherryll Burger last week.  Pt continues to have pain at the L volar palm at the base of the MCP of the IF, so began iontophoresis to this area today with good tolerance, intact skin.  Orders for iontophoresis tx to this site from Dr. Sherryll Burger.  Planning to trial 6 iontophoresis treatments to palm and reassess at that time.  Will continue to follow per plan.    PERFORMANCE DEFICITS: in functional skills including ADLs, IADLs, coordination, dexterity, edema, ROM, strength, pain, flexibility, Fine motor control, decreased knowledge of precautions, decreased knowledge of use of DME, and UE functional use. IMPAIRMENTS: are limiting patient from ADLs, IADLs, work, and leisure.    COMORBIDITIES: has co-morbidities such as hx of tumor resection in a R sided meningioma, LUE parasthesias from possible cubital tunnel and cervical radiculopathy  that affects occupational performance. Patient will benefit from skilled OT to address above impairments and improve overall function.  MODIFICATION OR ASSISTANCE TO COMPLETE EVALUATION: No modification of tasks or assist necessary to complete an evaluation.  OT OCCUPATIONAL PROFILE AND HISTORY: Problem focused assessment: Including review of records relating to presenting problem.  CLINICAL DECISION MAKING: Moderate - several treatment options, min-mod task modification necessary  REHAB POTENTIAL: Good  EVALUATION COMPLEXITY: Moderate    PLAN:  OT FREQUENCY: 2x/week  OT DURATION: 8 weeks  PLANNED INTERVENTIONS: self care/ADL training, therapeutic exercise, therapeutic activity, neuromuscular re-education, manual therapy, passive range of motion, splinting, ultrasound, iontophoresis, paraffin, moist heat, cryotherapy, contrast bath, patient/family education, and DME and/or AE instructions  RECOMMENDED OTHER SERVICES: None at this time  CONSULTED AND AGREED WITH PLAN OF CARE: Patient  PLAN FOR NEXT SESSION: see above  Danelle Earthly, MS, OTR/L  Otis Dials, OT 03/11/2023, 5:09 PM

## 2023-03-13 ENCOUNTER — Ambulatory Visit: Payer: BC Managed Care – PPO

## 2023-03-13 DIAGNOSIS — M65322 Trigger finger, left index finger: Secondary | ICD-10-CM

## 2023-03-13 DIAGNOSIS — M6281 Muscle weakness (generalized): Secondary | ICD-10-CM | POA: Diagnosis not present

## 2023-03-13 DIAGNOSIS — M79645 Pain in left finger(s): Secondary | ICD-10-CM

## 2023-03-13 DIAGNOSIS — R202 Paresthesia of skin: Secondary | ICD-10-CM

## 2023-03-14 NOTE — Therapy (Signed)
OUTPATIENT OCCUPATIONAL THERAPY ORTHO TREATMENT NOTE  Patient Name: Stephanie Francis MRN: [id number] DOB:01/14/1966, 57 y.o., female Today's Date: 03/14/2023  PCP: Dr. Maurine Minister REFERRING PROVIDER: Dr. Cristopher Peru  END OF SESSION:  OT End of Session - 03/14/23 1543     Visit Number 19    Number of Visits 23    Date for OT Re-Evaluation 03/25/23    Authorization Time Period Reporting period beginning 02/07/23    Progress Note Due on Visit 10    OT Start Time 1600    OT Stop Time 1645    OT Time Calculation (min) 45 min    Equipment Utilized During Treatment none    Activity Tolerance Patient tolerated treatment well    Behavior During Therapy [facility] for tasks assessed/performed            No past medical history on file. No past surgical history on file. There are no problems to display for this patient.  ONSET DATE: October 2023  REFERRING DIAG: Trigger Finger of L index finger  THERAPY DIAG:  Left hand paresthesia  Trigger index finger of left hand  Pain in left finger(s)  Rationale for Evaluation and Treatment: Rehabilitation  SUBJECTIVE:  SUBJECTIVE STATEMENT: Pt reports her L IF seems more swollen as she noticed that the oval 8 splint is leaving a red mark on her finger when she removes it and it did not used to do this. 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  Per chart/Dr. Sherryll Burger: Left hand paresthesia in a patient with cervical radiculopathy, some component of carpal tunnel syndrome, weakness - concerning for central component, possible cubital tunnel syndrome component. - Negative ESR, C Reactive Protein, ANA, Rheumatoid Arthritis Factor - Recommend NCS upper extremity MRI C-Spine without contrast - we will do Occupational Therapy consult Left hand trigger finger - ortho consult  PRECAUTIONS: None  WEIGHT BEARING RESTRICTIONS: No  PAIN:  03/13/23: Pt reports 2/10 pain Are you having pain? Yes: NPRS scale:  intermittently when throbbing 7/10 Pain location: L elbow, base of L IF palmar aspect Pain description: throbbing, tender Aggravating factors: gripping/lifting/carrying heavy objects Relieving factors: rest/avoiding use of the L hand  LIVING ENVIRONMENT: Lives with: lives alone  PLOF: Independent, works as an Environmental health practitioner for [employer]   PATIENT GOALS: "To avoid surgery and not to hurt."  NEXT MD VISIT: Return to Dr. Sherryll Burger June 13th   OBJECTIVE:   HAND DOMINANCE: Right   ADLs: Overall ADLs: indep but some pain in the L hand with gripping/pinching  FUNCTIONAL OUTCOME MEASURES: FOTO: 61; 69 01/28/23: 67 02/11/23: 67  UPPER EXTREMITY ROM:     Active ROM Right eval Left eval Right 01/28/23 Left  01/28/23  Elbow flexion   WNL WNL  Elbow extension   WNL WNL  Wrist flexion 74 66  70  Wrist extension 70 71  70  Wrist ulnar deviation      Wrist radial deviation      Wrist pronation   90 90  Wrist supination   64 70  (Blank rows = not tested)  Active ROM Right eval Left eval Right 01/28/23 Left 01/28/23  Elbow flexion   5 5  Elbow extension   5 5  Wrist flexion   5 4  Wrist extension   5 4  Thumb MCP (0-60)      Thumb IP (0-80)  Thumb Radial abd/add (0-55)       Thumb Palmar abd/add (0-45)       Thumb Opposition to Small Finger       Index MCP (0-90)  35   60  Index PIP (0-100)   95  70 (achy)  Index DIP (0-70)   50  50  Long MCP (0-90)        Long PIP (0-100)        Long DIP (0-70)        Ring MCP (0-90)        Ring PIP (0-100)        Ring DIP (0-70)        Little MCP (0-90)        Little PIP (0-100)        Little DIP (0-70)        (Blank rows = not tested)    *Eval:L IF 4 cm from [facility] 01/10/23: L IF 2 cm to [facility] 01/28/23: L IF 2 cm [facility]  02/07/23: L IF 1.5 cm to [facility]  HAND FUNCTION: Grip strength: Right: 47 lbs; Left: 23 lbs, Lateral pinch: Right: 17 lbs, Left: 13 lbs, 3 point pinch:  Right: 17 lbs, Left: 13 lbs, and Tip pinch: Right 9 lbs, Left: 4 lbs 01/10/23: L grip 38 lbs 01/28/23: R grip 46 lbs, L grip 34 lbs; lateral pinch: 17 lbs, 3 point pinch left: 15 lbs,  02/11/23: L grip 40, lateral pinch: 16 lbs, 3 point pinch left: 10 lbs  COORDINATION: 9 Hole Peg test: Right: 24 sec; Left: 24 sec  SENSATION: Pt reports numbness in L ulnar nerve distribution  for years; pt reports a neck injury 10+ years ago  01/28/23: L arm numbness constantly; concentrated at L elbow and distally to the hand along ulnar nerve distribution.  Pt reports she frequently drops items in L hand d/t lack of sensation; difficulty putting an earring in d/t lack of sensation.    EDEMA: Mild L volar palm  COGNITION: Overall cognitive status: Within functional limits for tasks assessed  OBSERVATIONS:  Unable to make composite fist on the L hand d/t stiffness in the IF.  Pain and weakness in the L hand limiting efficient use of the L hand with daily tasks.    TODAY'S TREATMENT:                                                                                                                              Paraffin:  X10 min for L hand pain management/muscle relaxation in prep for manual therapy.  Iontophoresis:  20 min tx to base of L palm at the volar aspect of the MCP of the IF/ A1 pulley to target pain from trigger finger; 2cc dexamethasone, 40 mA, 2.0 current; skin check performed following tx; intact/good tolerance.  Manual Therapy: Soft tissue massage performed at the base of the L IF MCP volar palm, and PIP joint of same digit,  working to increase circulation, decrease pain, decrease swelling related to trigger finger pain in this digit.   PATIENT EDUCATION: Education details: use of compression glove vs oval 8 splint Person educated: Patient Education method: explanation, vc Education comprehension: verbalized understanding  HOME EXERCISE PROGRAM: Ice massage, contrast bath, passive stretching  for L hand digits (flex/ext/abd), theraputty, tendon gliding  GOALS: Goals reviewed with patient? Yes   SHORT TERM GOALS: Target date: 02/25/23 (4 weeks)  Pt will be indep to perform HEP for improving LUE flexibility and strength for daily tasks (revised on 01/28/23 to include elbow/wrist/hand). Baseline: Eval: initiated ROM exercises; 01/28/23: Pt reports inconsistent stretching over the last couple of weeks; inconsistent use of theraputty; will plan to add ulnar nerve glides Goal status: ongoing  2.  Pt will be indep to verbalize 2-3 joint protection/cumulative trauma prevention strategies to reduce pain/paresthesias in the L hand.  Baseline: Eval: initiated educ, further training needed; 01/28/23: indep with joint protection/cumulative trauma prevention strategies Goal status: achieved  LONG TERM GOALS: Target date: 03/25/23 (8 weeks)  Pt will increase FOTO score to 69 or better to indicate improvement in self perceived functional use of the L hand with daily tasks.  Baseline: Eval: 61; 01/28/23: 67; 02/11/23: 67 Goal status: achieved  2.  Pt will increase L grip strength by 15 or more lbs in order to improve ability to hold and carry ADL supplies (revised 01/28/23).   Baseline: L grip 23 lbs (R 47 lbs); 01/28/23: L grip 34 lbs (R 46 lbs); 02/11/23: L grip 35 lbs Goal status: achieved/revised/ongoing  3.  Pt will increase L IF flexibility to enable pt to make a full composite fist to store ADL supplies without dropping. Baseline: Eval: Unable to make composite fist in L hand; L IF 4 cm from [facility]; 01/28/23: L IF 2 cm from [facility] Goal status: ongoing  4.  Pt will tolerate manual therapy, therapeutic modalities, exercises, and splinting as needed to decrease pain in L hand and elbow to a reported 3/10 pain or less with activity.   Baseline: Eval: L hand pain 7/10 pain with activity (throbs); 01/28/23: 0/10 pain in L hand at rest, tender 2-3/pain with touch (base of L IF, palmar aspect), 3/10 pain in L elbow at  rest, can reach 8/10 with activity Goal status: revised 01/28/23 to include elbow   5. With use of compensatory strategies, pt will be able to independently and efficiently clasp an earring in her L ear.  Baseline: Pt reports that with the numbness from her L elbow to the hand, clasping an earring in L ear take extensive time and trials.  Goal status: New  ASSESSMENT: CLINICAL IMPRESSION: Restarted paraffin tx for L hand pain management/muscle relaxation now that poison ivy rash has cleared.  Pt tolerated iontophoresis well to base of L IF at the volar palm for 2nd tx at this site.  Pt reports her L IF seems more swollen as she noticed that the oval 8 splint is leaving a red mark on her finger when she removes it and it did not used to do this.  Encouraged pt swap out the oval 8 with the compression glove to help with reducing swelling.  Pt has not been wearing the glove d/t poison ivy rashes on her hand, but this is now clear.  Pt will plan to wear the glove and return to wearing oval 8 splint when she feels like the splint fits properly.  Remaining visits will continue to focus on ionto  tx, pain management, therapeutic exercises, manual therapy, and therapeutic exercises to reduce pain and triggering in L index finger.  PERFORMANCE DEFICITS: in functional skills including ADLs, IADLs, coordination, dexterity, edema, ROM, strength, pain, flexibility, Fine motor control, decreased knowledge of precautions, decreased knowledge of use of DME, and UE functional use. IMPAIRMENTS: are limiting patient from ADLs, IADLs, work, and leisure.   COMORBIDITIES: has co-morbidities such as hx of tumor resection in a R sided meningioma, LUE parasthesias from possible cubital tunnel and cervical radiculopathy  that affects occupational performance. Patient will benefit from skilled OT to address above impairments and improve overall function.  MODIFICATION OR ASSISTANCE TO COMPLETE EVALUATION: No modification of tasks  or assist necessary to complete an evaluation.  OT OCCUPATIONAL PROFILE AND HISTORY: Problem focused assessment: Including review of records relating to presenting problem.  CLINICAL DECISION MAKING: Moderate - several treatment options, min-mod task modification necessary  REHAB POTENTIAL: Good  EVALUATION COMPLEXITY: Moderate    PLAN:  OT FREQUENCY: 2x/week  OT DURATION: 8 weeks  PLANNED INTERVENTIONS: self care/ADL training, therapeutic exercise, therapeutic activity, neuromuscular re-education, manual therapy, passive range of motion, splinting, ultrasound, iontophoresis, paraffin, moist heat, cryotherapy, contrast bath, patient/family education, and DME and/or AE instructions  RECOMMENDED OTHER SERVICES: None at this time  CONSULTED AND AGREED WITH PLAN OF CARE: Patient  PLAN FOR NEXT SESSION: see above  Danelle Earthly, MS, OTR/L  Otis Dials, OT 03/14/2023, 5:06 PM

## 2023-03-18 ENCOUNTER — Ambulatory Visit: Payer: BC Managed Care – PPO

## 2023-03-19 ENCOUNTER — Encounter: Payer: BC Managed Care – PPO | Admitting: Occupational Therapy

## 2023-03-25 ENCOUNTER — Ambulatory Visit: Payer: BC Managed Care – PPO | Attending: Neurology

## 2023-03-25 DIAGNOSIS — M79645 Pain in left finger(s): Secondary | ICD-10-CM | POA: Diagnosis present

## 2023-03-25 DIAGNOSIS — M65322 Trigger finger, left index finger: Secondary | ICD-10-CM | POA: Insufficient documentation

## 2023-03-25 DIAGNOSIS — M6281 Muscle weakness (generalized): Secondary | ICD-10-CM | POA: Insufficient documentation

## 2023-03-25 DIAGNOSIS — R202 Paresthesia of skin: Secondary | ICD-10-CM | POA: Insufficient documentation

## 2023-03-25 NOTE — Therapy (Signed)
OUTPATIENT OCCUPATIONAL THERAPY ORTHO PROGRESS AND RECERTIFICATION NOTE Reporting period beginning 02/07/23-03/25/23  Patient Name: Stephanie Francis MRN: [id number] DOB:10-26-1965, 57 y.o., female Today's Date: 03/25/2023  PCP: Dr. Maurine Minister REFERRING PROVIDER: Dr. Cristopher Peru  END OF SESSION:  OT End of Session - 03/25/23 1632     Visit Number 20    Number of Visits 25    Date for OT Re-Evaluation 04/12/23    Authorization Time Period Reporting period beginning 02/07/23-03/25/23    Progress Note Due on Visit 10    OT Start Time 1600    OT Stop Time 1655    OT Time Calculation (min) 55 min    Equipment Utilized During Treatment none    Activity Tolerance Patient tolerated treatment well    Behavior During Therapy [facility] for tasks assessed/performed            No past medical history on file. No past surgical history on file. There are no problems to display for this patient.  ONSET DATE: October 2023  REFERRING DIAG: Trigger Finger of L index finger  THERAPY DIAG:  Muscle weakness (generalized)  Left hand paresthesia  Trigger index finger of left hand  Pain in left finger(s)  Rationale for Evaluation and Treatment: Rehabilitation  SUBJECTIVE:  SUBJECTIVE STATEMENT: Pt reports she had a good time at the beach with her son last week.  She verbalized not wearing her oval 8 splint at the beach as she didn't want to loose it. 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  Per chart/Dr. Sherryll Burger: Left hand paresthesia in a patient with cervical radiculopathy, some component of carpal tunnel syndrome, weakness - concerning for central component, possible cubital tunnel syndrome component. - Negative ESR, C Reactive Protein, ANA, Rheumatoid Arthritis Factor - Recommend NCS upper extremity MRI C-Spine without contrast - we will do Occupational Therapy consult Left hand  trigger finger - ortho consult  PRECAUTIONS: None  WEIGHT BEARING RESTRICTIONS: No  PAIN: 03/25/23: Pt reports 2/10 pain base of index finger L hand Are you having pain? Yes: NPRS scale:  intermittently when throbbing 3-5/10 Pain location: base of L IF, palmar aspect Pain description: throbbing, tender Aggravating factors: gripping/lifting/carrying heavy objects Relieving factors: rest/avoiding use of the L hand  LIVING ENVIRONMENT: Lives with: lives alone  PLOF: Independent, works as an Environmental health practitioner for [employer]   PATIENT GOALS: "To avoid surgery and not to hurt."  NEXT MD VISIT: Return to Dr. Sherryll Burger June 13th   OBJECTIVE:   HAND DOMINANCE: Right   ADLs: Overall ADLs: indep but some pain in the L hand with gripping/pinching  FUNCTIONAL OUTCOME MEASURES: FOTO: 61; 69 01/28/23: 67 02/11/23: 67 03/25/23: 60  UPPER EXTREMITY ROM:     Active ROM Right eval Left eval Right 01/28/23 Left  01/28/23  Elbow flexion   WNL WNL  Elbow extension   WNL WNL  Wrist flexion 74 66  70  Wrist extension 70 71  70  Wrist ulnar deviation      Wrist radial deviation      Wrist pronation   90 90  Wrist supination   64 70  (Blank rows = not tested)  Active ROM Right eval Left eval Right 01/28/23 Left 01/28/23 Left 03/25/23  Elbow flexion   5 5   Elbow extension   5 5   Wrist flexion   5 4   Wrist  extension   5 4   Thumb MCP (0-60)       Thumb IP (0-80)       Thumb Radial abd/add (0-55)        Thumb Palmar abd/add (0-45)        Thumb Opposition to Small Finger        Index MCP (0-90)  35   60 60  Index PIP (0-100)   95  70 (achy) 105  Index DIP (0-70)   50  50   Long MCP (0-90)         Long PIP (0-100)         Long DIP (0-70)         Ring MCP (0-90)         Ring PIP (0-100)         Ring DIP (0-70)         Little MCP (0-90)         Little PIP (0-100)         Little DIP (0-70)         (Blank rows = not tested)    *Eval:L IF 4 cm from [facility] 01/10/23: L IF 2 cm to [facility] 01/28/23: L  IF 2 cm [facility]  02/07/23: L IF 1.5 cm to [facility] 03/25/23: L IF 1.5 cm to [facility]   HAND FUNCTION: Grip strength: Right: 47 lbs; Left: 23 lbs, Lateral pinch: Right: 17 lbs, Left: 13 lbs, 3 point pinch: Right: 17 lbs, Left: 13 lbs, and Tip pinch: Right 9 lbs, Left: 4 lbs 01/10/23: L grip 38 lbs 01/28/23: R grip 46 lbs, L grip 34 lbs; lateral pinch: 17 lbs, 3 point pinch left: 15 lbs,  02/11/23: L grip 35, lateral pinch: 16 lbs, 3 point pinch left: 10 lbs 03/25/23: L grip 30 lbs, R 45; lateral pinch: 14 lbs, R: 17 lbs; 3 point pinch left: 10 lbs, R: 13 lbs  COORDINATION: 9 Hole Peg test: Right: 24 sec; Left: 24 sec  SENSATION: Pt reports numbness in L ulnar nerve distribution  for years; pt reports a neck injury 10+ years ago  01/28/23: L arm numbness constantly; concentrated at L elbow and distally to the hand along ulnar nerve distribution.  Pt reports she frequently drops items in L hand d/t lack of sensation; difficulty putting an earring in d/t lack of sensation.    EDEMA: Mild L volar palm  COGNITION: Overall cognitive status: Within functional limits for tasks assessed  OBSERVATIONS:  Unable to make composite fist on the L hand d/t stiffness in the IF.  Pain and weakness in the L hand limiting efficient use of the L hand with daily tasks.    TODAY'S TREATMENT:                                                                                                                              Therapeutic Exercise: Objective measures taken and goals updated for  recertification.  Paraffin:  X10 min for L hand pain management/muscle relaxation in prep for manual therapy.  Iontophoresis:  24 min tx to base of L palm at the volar aspect of the MCP of the IF/ A1 pulley to target pain from trigger finger; 2cc dexamethasone, 40 mA, 1.6 current; skin check performed following tx; intact/good tolerance.  Manual Therapy: Soft tissue massage performed with Biofreeze at the base of the L IF MCP volar palm, and PIP  joint of same digit, working to increase circulation, decrease pain, decrease swelling related to trigger finger pain in this digit.    PATIENT EDUCATION: Education details: progress towards goals/recert plan Person educated: Patient Education method: explanation, vc Education comprehension: verbalized understanding  HOME EXERCISE PROGRAM: Ice massage, contrast bath, passive stretching for L hand digits (flex/ext/abd), theraputty, tendon gliding  GOALS: Goals reviewed with patient? Yes   SHORT TERM GOALS: Target date: 04/05/23 (2 weeks)  Pt will be indep to perform HEP for improving LUE flexibility and strength for daily tasks (revised on 01/28/23 to include elbow/wrist/hand). Baseline: Eval: initiated ROM exercises; 01/28/23: Pt reports inconsistent stretching over the last couple of weeks; inconsistent use of theraputty; will plan to add ulnar nerve glides; 03/25/23: pt verbalizes inconsistent stretching for the hand Goal status: ongoing  2.  Pt will be indep to verbalize 2-3 joint protection/cumulative trauma prevention strategies to reduce pain/paresthesias in the L hand.  Baseline: Eval: initiated educ, further training needed; 01/28/23: indep with joint protection/cumulative trauma prevention strategies Goal status: achieved  LONG TERM GOALS: Target date: 04/12/23 (3 weeks)  Pt will increase FOTO score to 69 or better to indicate improvement in self perceived functional use of the L hand with daily tasks.  Baseline: Eval: 61; 01/28/23: 67; 02/11/23: 67; 03/25/23: 60 (pt verbalizes that most FOTO questions do not apply to her in regards to leisure/recreational tasks) Goal status: ongoing  2.  Pt will increase L grip strength by 15 or more lbs in order to improve ability to hold and carry ADL supplies (revised 01/28/23).   Baseline: L grip 23 lbs (R 47 lbs); 01/28/23: L grip 34 lbs (R 46 lbs); 02/11/23: L grip 35 lbs; 03/25/23: L grip 30 lbs Goal status: ongoing  3.  Pt will increase L IF flexibility  to enable pt to make a full composite fist to store ADL supplies without dropping. Baseline: Eval: Unable to make composite fist in L hand; L IF 4 cm from [facility]; 01/28/23: L IF 2 cm from [facility]; 03/25/23: L IF 1.5 cm from [facility] Goal status: ongoing  4.  Pt will tolerate manual therapy, therapeutic modalities, exercises, and splinting as needed to decrease pain in L hand and elbow to a reported 3/10 pain or less with activity.   Baseline: Eval: L hand pain 7/10 pain with activity (throbs); 01/28/23: 0/10 pain in L hand at rest, tender 2-3/pain with touch (base of L IF, palmar aspect), 3/10 pain in L elbow at rest, can reach 8/10 with activity; 03/25/23: L hand 3-5/10 pain with activity  Goal status: ongoing  5. With use of compensatory strategies, pt will be able to independently and efficiently clasp an earring in her L ear. Baseline: Pt reports that with the numbness from her L elbow to the hand, clasping an earring in L ear take extensive time and trials; 03/25/23: extensive time and trials  Goal status: ongoing  ASSESSMENT: CLINICAL IMPRESSION: Pt seen this date for OT 20th visit progress update and recertification visit.  Overall, no  significant change in FOTO score, though pt feels that most questions don't apply to her in regards to recreation and leisure activities.  Pt is showing slow but fair improvements in L hand composite fist, though L IF PIP continues to trigger intermittently and was observed to trigger 2x today during ROM testing.  Pt admits to inconsistent stretching, icing, and use of oval 8 splint on the L IF to manage the triggering in this finger, but understands needing to increase consistency of above to make additional progress/improvements in the hand.  Focus of therapy began with reducing symptoms of L IF trigger finger, and this did improve.  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  Meanwhile, L hand triggering at the PIP of the index finger became an issue again, so iontophoresis was started at the base of the L IF volar palm to manage pain and swelling in this area.  Pt has completed 3 treatments of iontophoresis to the volar palm at today's tx session.  Plan to recert for up to 3 additional weeks to complete 6-8 more iontophoresis treatments to this area.  Iontophoresis treatments to L elbow were stopped once it was determined that pt's numbness in the L elbow, forearm, and hand along the ulnar nerve distribution was not related to cubital tunnel syndrome. Pt is tolerating iontophoresis treatments well thus far to the L palm.  Remaining visits will continue to focus on ionto tx, pain management, therapeutic exercises, and manual therapy to reduce pain and triggering in L index finger.  PERFORMANCE DEFICITS: in functional skills including ADLs, IADLs, coordination, dexterity, edema, ROM, strength, pain, flexibility, Fine motor control, decreased knowledge of precautions, decreased knowledge of use of DME, and UE functional use. IMPAIRMENTS: are limiting patient from ADLs, IADLs, work, and leisure.   COMORBIDITIES: has co-morbidities such as hx of tumor resection in a R sided meningioma, LUE parasthesias from possible cubital tunnel and cervical radiculopathy  that affects occupational performance. Patient will benefit from skilled OT to address above impairments and improve overall function.  MODIFICATION OR ASSISTANCE TO COMPLETE EVALUATION: No modification of tasks or assist necessary to complete an evaluation.  OT OCCUPATIONAL PROFILE AND HISTORY: Problem focused assessment: Including review of records relating to presenting problem.  CLINICAL DECISION MAKING: Moderate - several treatment options, min-mod task modification necessary  REHAB POTENTIAL: Good  EVALUATION COMPLEXITY:  Moderate    PLAN:  OT FREQUENCY: 2x/week  OT DURATION: 3 additional weeks  PLANNED INTERVENTIONS: self care/ADL training, therapeutic exercise, therapeutic activity, neuromuscular re-education, manual therapy, passive range of motion, splinting, ultrasound, iontophoresis, paraffin, moist heat, cryotherapy, contrast bath, patient/family education, and DME and/or AE instructions  RECOMMENDED OTHER SERVICES: None at this time  CONSULTED AND AGREED WITH PLAN OF CARE: Patient  PLAN FOR NEXT SESSION: see above  Danelle Earthly, MS, OTR/L  Otis Dials, OT 03/25/2023, 5:28 PM

## 2023-03-27 ENCOUNTER — Ambulatory Visit: Payer: BC Managed Care – PPO

## 2023-03-27 DIAGNOSIS — R202 Paresthesia of skin: Secondary | ICD-10-CM

## 2023-03-27 DIAGNOSIS — M6281 Muscle weakness (generalized): Secondary | ICD-10-CM | POA: Diagnosis not present

## 2023-03-27 DIAGNOSIS — M79645 Pain in left finger(s): Secondary | ICD-10-CM

## 2023-03-27 DIAGNOSIS — M65322 Trigger finger, left index finger: Secondary | ICD-10-CM

## 2023-03-27 NOTE — Therapy (Signed)
OUTPATIENT OCCUPATIONAL THERAPY ORTHO TREATMENT NOTE  Patient Name: Stephanie Francis MRN: [id number] DOB:1966/05/10, 57 y.o., female Today's Date: 03/27/2023  PCP: Dr. Maurine Minister REFERRING PROVIDER: Dr. Cristopher Peru  END OF SESSION:  OT End of Session - 03/27/23 1621     Visit Number 21    Number of Visits 25    Date for OT Re-Evaluation 04/12/23    Authorization Time Period Reporting period beginning 03/25/23    Progress Note Due on Visit 10    OT Start Time 1600    OT Stop Time 1645    OT Time Calculation (min) 45 min    Equipment Utilized During Treatment none    Activity Tolerance Patient tolerated treatment well    Behavior During Therapy [facility] for tasks assessed/performed            No past medical history on file. No past surgical history on file. There are no problems to display for this patient.  ONSET DATE: October 2023  REFERRING DIAG: Trigger Finger of L index finger  THERAPY DIAG:  Muscle weakness (generalized)  Left hand paresthesia  Trigger index finger of left hand  Pain in left finger(s)  Rationale for Evaluation and Treatment: Rehabilitation  SUBJECTIVE:  SUBJECTIVE STATEMENT: Pt reports she's been wearing her oval 8 splint a little more during the day.  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  Per chart/Dr. Sherryll Burger: Left hand paresthesia in a patient with cervical radiculopathy, some component of carpal tunnel syndrome, weakness - concerning for central component, possible cubital tunnel syndrome component. - Negative ESR, C Reactive Protein, ANA, Rheumatoid Arthritis Factor - Recommend NCS upper extremity MRI C-Spine without contrast - we will do Occupational Therapy consult Left hand trigger finger - ortho consult  PRECAUTIONS: None  WEIGHT BEARING RESTRICTIONS: No  PAIN: 03/27/23: Pt reports 2/10 pain base of index finger L  hand Are you having pain? Yes: NPRS scale:  intermittently when throbbing 3-5/10 Pain location: base of L IF, palmar aspect Pain description: throbbing, tender Aggravating factors: gripping/lifting/carrying heavy objects Relieving factors: rest/avoiding use of the L hand  LIVING ENVIRONMENT: Lives with: lives alone  PLOF: Independent, works as an Environmental health practitioner for [employer]   PATIENT GOALS: "To avoid surgery and not to hurt."  NEXT MD VISIT: Return to Dr. Sherryll Burger June 13th   OBJECTIVE:   HAND DOMINANCE: Right   ADLs: Overall ADLs: indep but some pain in the L hand with gripping/pinching  FUNCTIONAL OUTCOME MEASURES: FOTO: 61; 69 01/28/23: 67 02/11/23: 67 03/25/23: 60  UPPER EXTREMITY ROM:     Active ROM Right eval Left eval Right 01/28/23 Left  01/28/23  Elbow flexion   WNL WNL  Elbow extension   WNL WNL  Wrist flexion 74 66  70  Wrist extension 70 71  70  Wrist ulnar deviation      Wrist radial deviation      Wrist pronation   90 90  Wrist supination   64 70  (Blank rows = not tested)  Active ROM Right eval Left eval Right 01/28/23 Left 01/28/23 Left 03/25/23  Elbow flexion   5 5   Elbow extension   5 5   Wrist flexion   5 4   Wrist extension   5 4   Thumb MCP (0-60)       Thumb IP (0-80)  Thumb Radial abd/add (0-55)        Thumb Palmar abd/add (0-45)        Thumb Opposition to Small Finger        Index MCP (0-90)  35   60 60  Index PIP (0-100)   95  70 (achy) 105  Index DIP (0-70)   50  50   Long MCP (0-90)         Long PIP (0-100)         Long DIP (0-70)         Ring MCP (0-90)         Ring PIP (0-100)         Ring DIP (0-70)         Little MCP (0-90)         Little PIP (0-100)         Little DIP (0-70)         (Blank rows = not tested)    *Eval:L IF 4 cm from [facility] 01/10/23: L IF 2 cm to [facility] 01/28/23: L IF 2 cm [facility]  02/07/23: L IF 1.5 cm to [facility] 03/25/23: L IF 1.5 cm to [facility]   HAND FUNCTION: Grip strength: Right: 47 lbs; Left: 23 lbs, Lateral  pinch: Right: 17 lbs, Left: 13 lbs, 3 point pinch: Right: 17 lbs, Left: 13 lbs, and Tip pinch: Right 9 lbs, Left: 4 lbs 01/10/23: L grip 38 lbs 01/28/23: R grip 46 lbs, L grip 34 lbs; lateral pinch: 17 lbs, 3 point pinch left: 15 lbs,  02/11/23: L grip 35, lateral pinch: 16 lbs, 3 point pinch left: 10 lbs 03/25/23: L grip 30 lbs, R 45; lateral pinch: 14 lbs, R: 17 lbs; 3 point pinch left: 10 lbs, R: 13 lbs  COORDINATION: 9 Hole Peg test: Right: 24 sec; Left: 24 sec  SENSATION: Pt reports numbness in L ulnar nerve distribution  for years; pt reports a neck injury 10+ years ago  01/28/23: L arm numbness constantly; concentrated at L elbow and distally to the hand along ulnar nerve distribution.  Pt reports she frequently drops items in L hand d/t lack of sensation; difficulty putting an earring in d/t lack of sensation.    EDEMA: Mild L volar palm  COGNITION: Overall cognitive status: Within functional limits for tasks assessed  OBSERVATIONS:  Unable to make composite fist on the L hand d/t stiffness in the IF.  Pain and weakness in the L hand limiting efficient use of the L hand with daily tasks.    TODAY'S TREATMENT:                                                                                                                              Paraffin:  X10 min for L hand pain management/muscle relaxation in prep for manual therapy.  Iontophoresis:  24 min tx to base of L palm at the volar aspect of the MCP of  the IF/ A1 pulley to target pain from trigger finger; 2cc dexamethasone, 40 mA, 1.6 current; skin check performed following tx; intact/good tolerance.  Manual Therapy: Soft tissue massage performed with Biofreeze at the base of the L IF MCP volar palm, and PIP joint of same digit, working to increase circulation, decrease pain, decrease swelling related to trigger finger pain in this digit.    PATIENT EDUCATION: Education details: progress towards goals/recert plan Person educated:  Patient Education method: explanation, vc Education comprehension: verbalized understanding  HOME EXERCISE PROGRAM: Ice massage, contrast bath, passive stretching for L hand digits (flex/ext/abd), theraputty, tendon gliding  GOALS: Goals reviewed with patient? Yes   SHORT TERM GOALS: Target date: 04/05/23 (2 weeks)  Pt will be indep to perform HEP for improving LUE flexibility and strength for daily tasks (revised on 01/28/23 to include elbow/wrist/hand). Baseline: Eval: initiated ROM exercises; 01/28/23: Pt reports inconsistent stretching over the last couple of weeks; inconsistent use of theraputty; will plan to add ulnar nerve glides; 03/25/23: pt verbalizes inconsistent stretching for the hand Goal status: ongoing  2.  Pt will be indep to verbalize 2-3 joint protection/cumulative trauma prevention strategies to reduce pain/paresthesias in the L hand.  Baseline: Eval: initiated educ, further training needed; 01/28/23: indep with joint protection/cumulative trauma prevention strategies Goal status: achieved  LONG TERM GOALS: Target date: 04/12/23 (3 weeks)  Pt will increase FOTO score to 69 or better to indicate improvement in self perceived functional use of the L hand with daily tasks.  Baseline: Eval: 61; 01/28/23: 67; 02/11/23: 67; 03/25/23: 60 (pt verbalizes that most FOTO questions do not apply to her in regards to leisure/recreational tasks) Goal status: ongoing  2.  Pt will increase L grip strength by 15 or more lbs in order to improve ability to hold and carry ADL supplies (revised 01/28/23).   Baseline: L grip 23 lbs (R 47 lbs); 01/28/23: L grip 34 lbs (R 46 lbs); 02/11/23: L grip 35 lbs; 03/25/23: L grip 30 lbs Goal status: ongoing  3.  Pt will increase L IF flexibility to enable pt to make a full composite fist to store ADL supplies without dropping. Baseline: Eval: Unable to make composite fist in L hand; L IF 4 cm from [facility]; 01/28/23: L IF 2 cm from [facility]; 03/25/23: L IF 1.5 cm from [facility] Goal  status: ongoing  4.  Pt will tolerate manual therapy, therapeutic modalities, exercises, and splinting as needed to decrease pain in L hand and elbow to a reported 3/10 pain or less with activity.   Baseline: Eval: L hand pain 7/10 pain with activity (throbs); 01/28/23: 0/10 pain in L hand at rest, tender 2-3/pain with touch (base of L IF, palmar aspect), 3/10 pain in L elbow at rest, can reach 8/10 with activity; 03/25/23: L hand 3-5/10 pain with activity  Goal status: ongoing  5. With use of compensatory strategies, pt will be able to independently and efficiently clasp an earring in her L ear. Baseline: Pt reports that with the numbness from her L elbow to the hand, clasping an earring in L ear take extensive time and trials; 03/25/23: extensive time and trials  Goal status: ongoing  ASSESSMENT: CLINICAL IMPRESSION: Pt has completed 4 treatments of iontophoresis to the volar palm at today's tx session.  Skin check performed with no skin irritation noted.  Pt continues to tolerate this tx well, along with paraffin and manual therapy for pain management.  Pt reports she has started back to wearing her oval  8 splint on her L IF more during the day.  Remaining visits will continue to focus on ionto tx, pain management, therapeutic exercises, and manual therapy to reduce pain and triggering in L index finger.  PERFORMANCE DEFICITS: in functional skills including ADLs, IADLs, coordination, dexterity, edema, ROM, strength, pain, flexibility, Fine motor control, decreased knowledge of precautions, decreased knowledge of use of DME, and UE functional use. IMPAIRMENTS: are limiting patient from ADLs, IADLs, work, and leisure.   COMORBIDITIES: has co-morbidities such as hx of tumor resection in a R sided meningioma, LUE parasthesias from possible cubital tunnel and cervical radiculopathy  that affects occupational performance. Patient will benefit from skilled OT to address above impairments and improve overall  function.  MODIFICATION OR ASSISTANCE TO COMPLETE EVALUATION: No modification of tasks or assist necessary to complete an evaluation.  OT OCCUPATIONAL PROFILE AND HISTORY: Problem focused assessment: Including review of records relating to presenting problem.  CLINICAL DECISION MAKING: Moderate - several treatment options, min-mod task modification necessary  REHAB POTENTIAL: Good  EVALUATION COMPLEXITY: Moderate    PLAN:  OT FREQUENCY: 2x/week  OT DURATION: 3 additional weeks  PLANNED INTERVENTIONS: self care/ADL training, therapeutic exercise, therapeutic activity, neuromuscular re-education, manual therapy, passive range of motion, splinting, ultrasound, iontophoresis, paraffin, moist heat, cryotherapy, contrast bath, patient/family education, and DME and/or AE instructions  RECOMMENDED OTHER SERVICES: None at this time  CONSULTED AND AGREED WITH PLAN OF CARE: Patient  PLAN FOR NEXT SESSION: see above  Danelle Earthly, MS, OTR/L  Otis Dials, OT 03/27/2023, 4:27 PM

## 2023-04-01 ENCOUNTER — Ambulatory Visit: Payer: BC Managed Care – PPO

## 2023-04-01 DIAGNOSIS — R202 Paresthesia of skin: Secondary | ICD-10-CM

## 2023-04-01 DIAGNOSIS — M79645 Pain in left finger(s): Secondary | ICD-10-CM

## 2023-04-01 DIAGNOSIS — M65322 Trigger finger, left index finger: Secondary | ICD-10-CM

## 2023-04-01 DIAGNOSIS — M6281 Muscle weakness (generalized): Secondary | ICD-10-CM | POA: Diagnosis not present

## 2023-04-01 NOTE — Therapy (Signed)
OUTPATIENT OCCUPATIONAL THERAPY ORTHO TREATMENT NOTE  Patient Name: Stephanie Francis MRN: [id number] DOB:1966/02/16, 57 y.o., female Today's Date: 03/27/2023  PCP: Dr. Maurine Minister REFERRING PROVIDER: Dr. Cristopher Peru  END OF SESSION:  OT End of Session - 03/27/23 1621     Visit Number 21    Number of Visits 25    Date for OT Re-Evaluation 04/12/23    Authorization Time Period Reporting period beginning 03/25/23    Progress Note Due on Visit 10    OT Start Time 1600    OT Stop Time 1645    OT Time Calculation (min) 45 min    Equipment Utilized During Treatment none    Activity Tolerance Patient tolerated treatment well    Behavior During Therapy [facility] for tasks assessed/performed            No past medical history on file. No past surgical history on file. There are no problems to display for this patient.  ONSET DATE: October 2023  REFERRING DIAG: Trigger Finger of L index finger  THERAPY DIAG:  Muscle weakness (generalized)  Left hand paresthesia  Trigger index finger of left hand  Pain in left finger(s)  Rationale for Evaluation and Treatment: Rehabilitation  SUBJECTIVE:  SUBJECTIVE STATEMENT: Pt requested a contrast bath this date in place of the iontophoresis, reporting her L hand felt more sore today after working in the yard over the weekend.  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  Per chart/Dr. Sherryll Burger: Left hand paresthesia in a patient with cervical radiculopathy, some component of carpal tunnel syndrome, weakness - concerning for central component, possible cubital tunnel syndrome component. - Negative ESR, C Reactive Protein, ANA, Rheumatoid Arthritis Factor - Recommend NCS upper extremity MRI C-Spine without contrast - we will do Occupational Therapy consult Left hand trigger finger - ortho consult  PRECAUTIONS: None  WEIGHT BEARING  RESTRICTIONS: No  PAIN: 04/01/23: Pt reports 2/10 pain base of index finger L hand at rest, 3 with activity Are you having pain? Yes: NPRS scale:  intermittently when throbbing 3-5/10 Pain location: base of L IF, palmar aspect Pain description: throbbing, tender Aggravating factors: gripping/lifting/carrying heavy objects Relieving factors: rest/avoiding use of the L hand  LIVING ENVIRONMENT: Lives with: lives alone  PLOF: Independent, works as an Environmental health practitioner for [employer]   PATIENT GOALS: "To avoid surgery and not to hurt."  NEXT MD VISIT: Return to Dr. Sherryll Burger June 13th   OBJECTIVE:   HAND DOMINANCE: Right   ADLs: Overall ADLs: indep but some pain in the L hand with gripping/pinching  FUNCTIONAL OUTCOME MEASURES: FOTO: 61; 69 01/28/23: 67 02/11/23: 67 03/25/23: 60  UPPER EXTREMITY ROM:     Active ROM Right eval Left eval Right 01/28/23 Left  01/28/23  Elbow flexion   WNL WNL  Elbow extension   WNL WNL  Wrist flexion 74 66  70  Wrist extension 70 71  70  Wrist ulnar deviation      Wrist radial deviation      Wrist pronation   90 90  Wrist supination   64 70  (Blank rows = not tested)  Active ROM Right eval Left eval Right 01/28/23 Left 01/28/23 Left 03/25/23  Elbow flexion   5 5   Elbow extension   5 5   Wrist flexion   5 4   Wrist extension   5 4  Thumb MCP (0-60)       Thumb IP (0-80)       Thumb Radial abd/add (0-55)        Thumb Palmar abd/add (0-45)        Thumb Opposition to Small Finger        Index MCP (0-90)  35   60 60  Index PIP (0-100)   95  70 (achy) 105  Index DIP (0-70)   50  50   Long MCP (0-90)         Long PIP (0-100)         Long DIP (0-70)         Ring MCP (0-90)         Ring PIP (0-100)         Ring DIP (0-70)         Little MCP (0-90)         Little PIP (0-100)         Little DIP (0-70)         (Blank rows = not tested)    *Eval:L IF 4 cm from [facility] 01/10/23: L IF 2 cm to [facility] 01/28/23: L IF 2 cm [facility]  02/07/23: L IF 1.5 cm to  [facility] 03/25/23: L IF 1.5 cm to [facility]   HAND FUNCTION: Grip strength: Right: 47 lbs; Left: 23 lbs, Lateral pinch: Right: 17 lbs, Left: 13 lbs, 3 point pinch: Right: 17 lbs, Left: 13 lbs, and Tip pinch: Right 9 lbs, Left: 4 lbs 01/10/23: L grip 38 lbs 01/28/23: R grip 46 lbs, L grip 34 lbs; lateral pinch: 17 lbs, 3 point pinch left: 15 lbs,  02/11/23: L grip 35, lateral pinch: 16 lbs, 3 point pinch left: 10 lbs 03/25/23: L grip 30 lbs, R 45; lateral pinch: 14 lbs, R: 17 lbs; 3 point pinch left: 10 lbs, R: 13 lbs  COORDINATION: 9 Hole Peg test: Right: 24 sec; Left: 24 sec  SENSATION: Pt reports numbness in L ulnar nerve distribution  for years; pt reports a neck injury 10+ years ago  01/28/23: L arm numbness constantly; concentrated at L elbow and distally to the hand along ulnar nerve distribution.  Pt reports she frequently drops items in L hand d/t lack of sensation; difficulty putting an earring in d/t lack of sensation.    EDEMA: Mild L volar palm  COGNITION: Overall cognitive status: Within functional limits for tasks assessed  OBSERVATIONS:  Unable to make composite fist on the L hand d/t stiffness in the IF.  Pain and weakness in the L hand limiting efficient use of the L hand with daily tasks.    TODAY'S TREATMENT:                                                                                                                              Contrast Bath: 20 min Alternating 4 min moist heat then 1 min cold pack x3 rounds,  beginning and ending with 4 min moist heat to L hand, working to increase circulation/decrease pain/decrease swelling at the L IF and base of this finger at the palm.  Manual Therapy: Soft tissue massage performed at the base of the L IF MCP volar palm, and PIP joint of same digit, working to increase circulation, decrease pain, decrease swelling related to trigger finger pain in this digit.    Therapeutic Exercise: Performed passive stretch for L hand finger ext/abd, passive  composite fist and isolated L IF MP, PIP flex/ext stretch.   PATIENT EDUCATION: Education details: activity modifications with yard work Person educated: Patient Education method: explanation, vc Education comprehension: verbalized understanding  HOME EXERCISE PROGRAM: Ice massage, contrast bath, passive stretching for L hand digits (flex/ext/abd), theraputty, tendon gliding  GOALS: Goals reviewed with patient? Yes   SHORT TERM GOALS: Target date: 04/05/23 (2 weeks)  Pt will be indep to perform HEP for improving LUE flexibility and strength for daily tasks (revised on 01/28/23 to include elbow/wrist/hand). Baseline: Eval: initiated ROM exercises; 01/28/23: Pt reports inconsistent stretching over the last couple of weeks; inconsistent use of theraputty; will plan to add ulnar nerve glides; 03/25/23: pt verbalizes inconsistent stretching for the hand Goal status: ongoing  2.  Pt will be indep to verbalize 2-3 joint protection/cumulative trauma prevention strategies to reduce pain/paresthesias in the L hand.  Baseline: Eval: initiated educ, further training needed; 01/28/23: indep with joint protection/cumulative trauma prevention strategies Goal status: achieved  LONG TERM GOALS: Target date: 04/12/23 (3 weeks)  Pt will increase FOTO score to 69 or better to indicate improvement in self perceived functional use of the L hand with daily tasks.  Baseline: Eval: 61; 01/28/23: 67; 02/11/23: 67; 03/25/23: 60 (pt verbalizes that most FOTO questions do not apply to her in regards to leisure/recreational tasks) Goal status: ongoing  2.  Pt will increase L grip strength by 15 or more lbs in order to improve ability to hold and carry ADL supplies (revised 01/28/23).   Baseline: L grip 23 lbs (R 47 lbs); 01/28/23: L grip 34 lbs (R 46 lbs); 02/11/23: L grip 35 lbs; 03/25/23: L grip 30 lbs Goal status: ongoing  3.  Pt will increase L IF flexibility to enable pt to make a full composite fist to store ADL supplies  without dropping. Baseline: Eval: Unable to make composite fist in L hand; L IF 4 cm from [facility]; 01/28/23: L IF 2 cm from [facility]; 03/25/23: L IF 1.5 cm from [facility] Goal status: ongoing  4.  Pt will tolerate manual therapy, therapeutic modalities, exercises, and splinting as needed to decrease pain in L hand and elbow to a reported 3/10 pain or less with activity.   Baseline: Eval: L hand pain 7/10 pain with activity (throbs); 01/28/23: 0/10 pain in L hand at rest, tender 2-3/pain with touch (base of L IF, palmar aspect), 3/10 pain in L elbow at rest, can reach 8/10 with activity; 03/25/23: L hand 3-5/10 pain with activity  Goal status: ongoing  5. With use of compensatory strategies, pt will be able to independently and efficiently clasp an earring in her L ear. Baseline: Pt reports that with the numbness from her L elbow to the hand, clasping an earring in L ear take extensive time and trials; 03/25/23: extensive time and trials  Goal status: ongoing  ASSESSMENT: CLINICAL IMPRESSION: No iontophoresis done today per pt request to replace this modality with contrast bath d/t increased soreness in the hand from doing yard work  this weekend.  Activity modifications/joint protection reviewed for yard work; pt verbalized understanding.  Pt did state that she took frequent rest breaks, but did not stretch, and did excessive lifting of flat stones, estimating 5+ lbs x20 total.  Pt was encouraged to use a wheel barrow but stated that the transport distance was not long enough to make good use of the wheelbarrow.  Good tolerance to contrast bath, manual therapy, and passive stretching this date.  Pt reports that she is using her oval 8 splint a little more during the day.   Pt has completed 4 treatments of iontophoresis to the volar palm (not completed today).  Remaining visits will continue to focus on ionto tx, pain management, therapeutic exercises, and manual therapy to reduce pain and triggering in L index  finger.  PERFORMANCE DEFICITS: in functional skills including ADLs, IADLs, coordination, dexterity, edema, ROM, strength, pain, flexibility, Fine motor control, decreased knowledge of precautions, decreased knowledge of use of DME, and UE functional use. IMPAIRMENTS: are limiting patient from ADLs, IADLs, work, and leisure.   COMORBIDITIES: has co-morbidities such as hx of tumor resection in a R sided meningioma, LUE parasthesias from possible cubital tunnel and cervical radiculopathy  that affects occupational performance. Patient will benefit from skilled OT to address above impairments and improve overall function.  MODIFICATION OR ASSISTANCE TO COMPLETE EVALUATION: No modification of tasks or assist necessary to complete an evaluation.  OT OCCUPATIONAL PROFILE AND HISTORY: Problem focused assessment: Including review of records relating to presenting problem.  CLINICAL DECISION MAKING: Moderate - several treatment options, min-mod task modification necessary  REHAB POTENTIAL: Good  EVALUATION COMPLEXITY: Moderate    PLAN:  OT FREQUENCY: 2x/week  OT DURATION: 3 additional weeks  PLANNED INTERVENTIONS: self care/ADL training, therapeutic exercise, therapeutic activity, neuromuscular re-education, manual therapy, passive range of motion, splinting, ultrasound, iontophoresis, paraffin, moist heat, cryotherapy, contrast bath, patient/family education, and DME and/or AE instructions  RECOMMENDED OTHER SERVICES: None at this time  CONSULTED AND AGREED WITH PLAN OF CARE: Patient  PLAN FOR NEXT SESSION: see above  Danelle Earthly, MS, OTR/L  Otis Dials, OT 03/27/2023, 4:27 PM

## 2023-04-03 ENCOUNTER — Ambulatory Visit: Payer: BC Managed Care – PPO

## 2023-04-04 ENCOUNTER — Ambulatory Visit: Payer: BC Managed Care – PPO

## 2023-04-08 ENCOUNTER — Ambulatory Visit: Payer: BC Managed Care – PPO

## 2023-04-08 DIAGNOSIS — R202 Paresthesia of skin: Secondary | ICD-10-CM

## 2023-04-08 DIAGNOSIS — M65322 Trigger finger, left index finger: Secondary | ICD-10-CM

## 2023-04-08 DIAGNOSIS — M6281 Muscle weakness (generalized): Secondary | ICD-10-CM | POA: Diagnosis not present

## 2023-04-08 DIAGNOSIS — M79645 Pain in left finger(s): Secondary | ICD-10-CM

## 2023-04-08 NOTE — Therapy (Unsigned)
OUTPATIENT OCCUPATIONAL THERAPY ORTHO TREATMENT NOTE  Patient Name: Stephanie Francis MRN: [id number] DOB:[birth year], 57 y.o., female Today's Date: 04/09/2023  PCP: Dr. Maurine Minister REFERRING PROVIDER: Dr. Cristopher Peru  END OF SESSION:  OT End of Session - 04/08/23 1618     Visit Number 23    Number of Visits 25    Date for OT Re-Evaluation 04/12/23    Authorization Time Period Reporting period beginning 03/25/23    Progress Note Due on Visit 10    OT Start Time 1615    OT Stop Time 1700    OT Time Calculation (min) 45 min    Equipment Utilized During Treatment none    Activity Tolerance Patient tolerated treatment well    Behavior During Therapy [facility] for tasks assessed/performed            No past medical history on file. No past surgical history on file. There are no problems to display for this patient.  ONSET DATE: October 2023  REFERRING DIAG: Trigger Finger of L index finger  THERAPY DIAG:  Muscle weakness (generalized)  Trigger index finger of left hand  Left hand paresthesia  Pain in left finger(s)  Rationale for Evaluation and Treatment: Rehabilitation  SUBJECTIVE:  SUBJECTIVE STATEMENT: Pt reports she's been alternating between using her compression glove and wearing her oval 8 splint to manage her L hand pain. 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  Per chart/Dr. Sherryll Burger: Left hand paresthesia in a patient with cervical radiculopathy, some component of carpal tunnel syndrome, weakness - concerning for central component, possible cubital tunnel syndrome component. - Negative ESR, C Reactive Protein, ANA, Rheumatoid Arthritis Factor - Recommend NCS upper extremity MRI C-Spine without contrast - we will do Occupational Therapy consult Left hand trigger finger - ortho consult  PRECAUTIONS: None  WEIGHT BEARING RESTRICTIONS: No  PAIN: 04/08/23:  Pt reports 3-4/10 pain base of index finger L hand and PIP digit of same digit at rest and with activity Are you having pain? Yes: NPRS scale:  intermittently when throbbing 3-5/10 Pain location: base of L IF, palmar aspect Pain description: throbbing, tender Aggravating factors: gripping/lifting/carrying heavy objects Relieving factors: rest/avoiding use of the L hand  LIVING ENVIRONMENT: Lives with: lives alone  PLOF: Independent, works as an Environmental health practitioner for [employer]   PATIENT GOALS: "To avoid surgery and not to hurt."  NEXT MD VISIT: Return to Dr. Sherryll Burger June 13th   OBJECTIVE:   HAND DOMINANCE: Right   ADLs: Overall ADLs: indep but some pain in the L hand with gripping/pinching  FUNCTIONAL OUTCOME MEASURES: FOTO: 61; 69 01/28/23: 67 02/11/23: 67 03/25/23: 60  UPPER EXTREMITY ROM:     Active ROM Right eval Left eval Right 01/28/23 Left  01/28/23  Elbow flexion   WNL WNL  Elbow extension   WNL WNL  Wrist flexion 74 66  70  Wrist extension 70 71  70  Wrist ulnar deviation      Wrist radial deviation      Wrist pronation   90 90  Wrist supination   64 70  (Blank rows = not tested)  Active ROM Right eval Left eval Right 01/28/23 Left 01/28/23 Left 03/25/23  Elbow flexion   5 5   Elbow extension   5 5   Wrist flexion   5 4   Wrist extension   5 4  Thumb MCP (0-60)       Thumb IP (0-80)       Thumb Radial abd/add (0-55)        Thumb Palmar abd/add (0-45)        Thumb Opposition to Small Finger        Index MCP (0-90)  35   60 60  Index PIP (0-100)   95  70 (achy) 105  Index DIP (0-70)   50  50   Long MCP (0-90)         Long PIP (0-100)         Long DIP (0-70)         Ring MCP (0-90)         Ring PIP (0-100)         Ring DIP (0-70)         Little MCP (0-90)         Little PIP (0-100)         Little DIP (0-70)         (Blank rows = not tested)    *Eval:L IF 4 cm from [facility] 01/10/23: L IF 2 cm to [facility] 01/28/23: L IF 2 cm [facility]  02/07/23: L IF 1.5 cm to  [facility] 03/25/23: L IF 1.5 cm to [facility]   HAND FUNCTION: Grip strength: Right: 47 lbs; Left: 23 lbs, Lateral pinch: Right: 17 lbs, Left: 13 lbs, 3 point pinch: Right: 17 lbs, Left: 13 lbs, and Tip pinch: Right 9 lbs, Left: 4 lbs 01/10/23: L grip 38 lbs 01/28/23: R grip 46 lbs, L grip 34 lbs; lateral pinch: 17 lbs, 3 point pinch left: 15 lbs,  02/11/23: L grip 35, lateral pinch: 16 lbs, 3 point pinch left: 10 lbs 03/25/23: L grip 30 lbs, R 45; lateral pinch: 14 lbs, R: 17 lbs; 3 point pinch left: 10 lbs, R: 13 lbs  COORDINATION: 9 Hole Peg test: Right: 24 sec; Left: 24 sec  SENSATION: Pt reports numbness in L ulnar nerve distribution  for years; pt reports a neck injury 10+ years ago  01/28/23: L arm numbness constantly; concentrated at L elbow and distally to the hand along ulnar nerve distribution.  Pt reports she frequently drops items in L hand d/t lack of sensation; difficulty putting an earring in d/t lack of sensation.    EDEMA: Mild L volar palm  COGNITION: Overall cognitive status: Within functional limits for tasks assessed  OBSERVATIONS:  Unable to make composite fist on the L hand d/t stiffness in the IF.  Pain and weakness in the L hand limiting efficient use of the L hand with daily tasks.    TODAY'S TREATMENT:                                                                                                                              Contrast Bath: 15 min Alternating 3 min moist heat then 1 min cold pack x3 rounds,  beginning and ending with 3 min moist heat to L hand, working to increase circulation/decrease pain/decrease swelling at the L IF and base of this finger at the palm.  Manual Therapy: Soft tissue massage performed at the base of the L IF MCP volar palm, and PIP joint of same digit, working to increase circulation, decrease pain, decrease swelling related to trigger finger pain in this digit.    Therapeutic Exercise: Performed passive stretch for L hand finger ext/abd, passive  composite fist and isolated L IF MP, PIP flex/ext stretch.   PATIENT EDUCATION: Education details: follow up with ortho upon OT d/c to assess need for another cortisone injection Person educated: Patient Education method: vc Education comprehension: verbalized understanding  HOME EXERCISE PROGRAM: Ice massage, contrast bath, passive stretching for L hand digits (flex/ext/abd), theraputty, tendon gliding  GOALS: Goals reviewed with patient? Yes   SHORT TERM GOALS: Target date: 04/05/23 (2 weeks)  Pt will be indep to perform HEP for improving LUE flexibility and strength for daily tasks (revised on 01/28/23 to include elbow/wrist/hand). Baseline: Eval: initiated ROM exercises; 01/28/23: Pt reports inconsistent stretching over the last couple of weeks; inconsistent use of theraputty; will plan to add ulnar nerve glides; 03/25/23: pt verbalizes inconsistent stretching for the hand Goal status: ongoing  2.  Pt will be indep to verbalize 2-3 joint protection/cumulative trauma prevention strategies to reduce pain/paresthesias in the L hand.  Baseline: Eval: initiated educ, further training needed; 01/28/23: indep with joint protection/cumulative trauma prevention strategies Goal status: achieved  LONG TERM GOALS: Target date: 04/12/23 (3 weeks)  Pt will increase FOTO score to 69 or better to indicate improvement in self perceived functional use of the L hand with daily tasks.  Baseline: Eval: 61; 01/28/23: 67; 02/11/23: 67; 03/25/23: 60 (pt verbalizes that most FOTO questions do not apply to her in regards to leisure/recreational tasks) Goal status: ongoing  2.  Pt will increase L grip strength by 15 or more lbs in order to improve ability to hold and carry ADL supplies (revised 01/28/23).   Baseline: L grip 23 lbs (R 47 lbs); 01/28/23: L grip 34 lbs (R 46 lbs); 02/11/23: L grip 35 lbs; 03/25/23: L grip 30 lbs Goal status: ongoing  3.  Pt will increase L IF flexibility to enable pt to make a full composite fist  to store ADL supplies without dropping. Baseline: Eval: Unable to make composite fist in L hand; L IF 4 cm from [facility]; 01/28/23: L IF 2 cm from [facility]; 03/25/23: L IF 1.5 cm from [facility] Goal status: ongoing  4.  Pt will tolerate manual therapy, therapeutic modalities, exercises, and splinting as needed to decrease pain in L hand and elbow to a reported 3/10 pain or less with activity.   Baseline: Eval: L hand pain 7/10 pain with activity (throbs); 01/28/23: 0/10 pain in L hand at rest, tender 2-3/pain with touch (base of L IF, palmar aspect), 3/10 pain in L elbow at rest, can reach 8/10 with activity; 03/25/23: L hand 3-5/10 pain with activity  Goal status: ongoing  5. With use of compensatory strategies, pt will be able to independently and efficiently clasp an earring in her L ear. Baseline: Pt reports that with the numbness from her L elbow to the hand, clasping an earring in L ear take extensive time and trials; 03/25/23: extensive time and trials  Goal status: ongoing  ASSESSMENT: CLINICAL IMPRESSION: Pt requested contrast bath in place of iontophoresis this date.  Tolerated well.  Pt reports  that she is using her oval 8 splint a little more during the day, though still noted some triggering in the L IF PIP joint during today's session.  Pt continues to report inconsistent use of her oval 8 splint, and inconsistent icing and stretching.  L hand IF at the PIP and MCP at the volar palm remain tender, and FDP remains tight.  Planning d/c next visit with recommendation to follow up with ortho as needed to assess need for another cortisone injection.  Pt in agreement with plan.  PERFORMANCE DEFICITS: in functional skills including ADLs, IADLs, coordination, dexterity, edema, ROM, strength, pain, flexibility, Fine motor control, decreased knowledge of precautions, decreased knowledge of use of DME, and UE functional use. IMPAIRMENTS: are limiting patient from ADLs, IADLs, work, and leisure.   COMORBIDITIES: has  co-morbidities such as hx of tumor resection in a R sided meningioma, LUE parasthesias from possible cubital tunnel and cervical radiculopathy  that affects occupational performance. Patient will benefit from skilled OT to address above impairments and improve overall function.  MODIFICATION OR ASSISTANCE TO COMPLETE EVALUATION: No modification of tasks or assist necessary to complete an evaluation.  OT OCCUPATIONAL PROFILE AND HISTORY: Problem focused assessment: Including review of records relating to presenting problem.  CLINICAL DECISION MAKING: Moderate - several treatment options, min-mod task modification necessary  REHAB POTENTIAL: Good  EVALUATION COMPLEXITY: Moderate    PLAN:  OT FREQUENCY: 2x/week  OT DURATION: 3 additional weeks  PLANNED INTERVENTIONS: self care/ADL training, therapeutic exercise, therapeutic activity, neuromuscular re-education, manual therapy, passive range of motion, splinting, ultrasound, iontophoresis, paraffin, moist heat, cryotherapy, contrast bath, patient/family education, and DME and/or AE instructions  RECOMMENDED OTHER SERVICES: None at this time  CONSULTED AND AGREED WITH PLAN OF CARE: Patient  PLAN FOR NEXT SESSION: see above  Danelle Earthly, MS, OTR/L  Otis Dials, OT 04/09/2023, 9:03 AM

## 2023-04-10 ENCOUNTER — Ambulatory Visit: Payer: BC Managed Care – PPO

## 2023-04-10 DIAGNOSIS — M79645 Pain in left finger(s): Secondary | ICD-10-CM

## 2023-04-10 DIAGNOSIS — M65322 Trigger finger, left index finger: Secondary | ICD-10-CM

## 2023-04-10 DIAGNOSIS — R202 Paresthesia of skin: Secondary | ICD-10-CM

## 2023-04-10 DIAGNOSIS — M6281 Muscle weakness (generalized): Secondary | ICD-10-CM | POA: Diagnosis not present

## 2023-04-10 NOTE — Therapy (Signed)
OUTPATIENT OCCUPATIONAL THERAPY ORTHO DISCHARGE NOTE  Patient Name: Stephanie Francis MRN: [id number] DOB:07-16-1966, 57 y.o., female Today's Date: 04/10/2023  PCP: Dr. Maurine Minister REFERRING PROVIDER: Dr. Cristopher Peru  END OF SESSION:  OT End of Session - 04/10/23 1537     Visit Number 24    Number of Visits 25    Date for OT Re-Evaluation 04/12/23    Authorization Time Period Reporting period beginning 03/25/23    Progress Note Due on Visit 10    OT Start Time 1530    OT Stop Time 1615    OT Time Calculation (min) 45 min    Equipment Utilized During Treatment none    Activity Tolerance Patient tolerated treatment well    Behavior During Therapy [facility] for tasks assessed/performed            No past medical history on file. No past surgical history on file. There are no problems to display for this patient.  ONSET DATE: October 2023  REFERRING DIAG: Trigger Finger of L index finger  THERAPY DIAG:  Trigger index finger of left hand  Left hand paresthesia  Pain in left finger(s)  Rationale for Evaluation and Treatment: Rehabilitation  SUBJECTIVE:  SUBJECTIVE STATEMENT: Pt reports she's been wearing her oval 8 splint more at work and home this week. 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  Per chart/Dr. Sherryll Burger: Left hand paresthesia in a patient with cervical radiculopathy, some component of carpal tunnel syndrome, weakness - concerning for central component, possible cubital tunnel syndrome component. - Negative ESR, C Reactive Protein, ANA, Rheumatoid Arthritis Factor - Recommend NCS upper extremity MRI C-Spine without contrast - we will do Occupational Therapy consult Left hand trigger finger - ortho consult  PRECAUTIONS: None  WEIGHT BEARING RESTRICTIONS: No  PAIN: 04/10/23: Pt reports 1/10 pain at rest, 3/10 pain base of index finger L hand and PIP  digit of same digit with activity Are you having pain? Yes: NPRS scale:  intermittently when throbbing 3-5/10 Pain location: base of L IF, palmar aspect Pain description: throbbing, tender Aggravating factors: gripping/lifting/carrying heavy objects Relieving factors: rest/avoiding use of the L hand  LIVING ENVIRONMENT: Lives with: lives alone  PLOF: Independent, works as an Environmental health practitioner for [employer]   PATIENT GOALS: "To avoid surgery and not to hurt."  NEXT MD VISIT: Return to Dr. Sherryll Burger June 13th   OBJECTIVE:   HAND DOMINANCE: Right   ADLs: Overall ADLs: indep but some pain in the L hand with gripping/pinching  FUNCTIONAL OUTCOME MEASURES: FOTO: 61; 69 01/28/23: 67 02/11/23: 67 03/25/23: 60 04/10/23: 63  UPPER EXTREMITY ROM:     Active ROM Right eval Left eval Right 01/28/23 Left  01/28/23 Left 04/10/23  Elbow flexion   WNL WNL WNL  Elbow extension   WNL WNL WNL  Wrist flexion 74 66  70 70  Wrist extension 70 71  70 70  Wrist ulnar deviation       Wrist radial deviation       Wrist pronation   90 90 90  Wrist supination   64 70 70  (Blank rows = not tested)  Active ROM Right eval Left eval Right 01/28/23 Left 01/28/23 Left 03/25/23 Left 04/10/23  Elbow flexion   5 5  5   Elbow extension   5 5  5   Wrist flexion   5 4  5  Wrist extension   5 4  5   Thumb MCP (0-60)        Thumb IP (0-80)        Thumb Radial abd/add (0-55)         Thumb Palmar abd/add (0-45)         Thumb Opposition to Small Finger         Index MCP (0-90)  35   60 60 64  Index PIP (0-100)   95  70 (achy) 105 105  Index DIP (0-70)   50  50  50  Long MCP (0-90)          Long PIP (0-100)          Long DIP (0-70)          Ring MCP (0-90)          Ring PIP (0-100)          Ring DIP (0-70)          Little MCP (0-90)          Little PIP (0-100)          Little DIP (0-70)          (Blank rows = not tested)    *Eval:L IF 4 cm from [facility] 01/10/23: L IF 2 cm to [facility] 01/28/23: L IF 2 cm [facility]  02/07/23:  L IF 1.5 cm to [facility] 03/25/23: L IF 1.5 cm to [facility]  04/10/23: L IF 1.5 cm to [facility]  HAND FUNCTION: Grip strength: Right: 47 lbs; Left: 23 lbs, Lateral pinch: Right: 17 lbs, Left: 13 lbs, 3 point pinch: Right: 17 lbs, Left: 13 lbs, and Tip pinch: Right 9 lbs, Left: 4 lbs 01/10/23: L grip 38 lbs 01/28/23: R grip 46 lbs, L grip 34 lbs; lateral pinch: 17 lbs, 3 point pinch left: 15 lbs,  02/11/23: L grip 35, lateral pinch: 16 lbs, 3 point pinch left: 10 lbs 03/25/23: L grip 30 lbs, R 45; lateral pinch: L 14 lbs, R: 17 lbs; 3 point pinch left: 10 lbs, R: 13 lbs  04/10/23: L grip 39 lbs, R: 46; lateral pinch: L: 14 lbs, R: 17 lbs; 3 point pinch Left: 10 lbs, R: 13 lbs  COORDINATION: 9 Hole Peg test: Right: 24 sec; Left: 24 sec  SENSATION: Pt reports numbness in L ulnar nerve distribution  for years; pt reports a neck injury 10+ years ago  01/28/23: L arm numbness constantly; concentrated at L elbow and distally to the hand along ulnar nerve distribution.  Pt reports she frequently drops items in L hand d/t lack of sensation; difficulty putting an earring in d/t lack of sensation.    EDEMA: Mild L volar palm  COGNITION: Overall cognitive status: Within functional limits for tasks assessed  OBSERVATIONS:  Unable to make composite fist on the L hand d/t stiffness in the IF.  Pain and weakness in the L hand limiting efficient use of the L hand with daily tasks.    TODAY'S TREATMENT:  Contrast Bath: 15 min Alternating 3 min moist heat then 1 min cold pack x3 rounds, beginning and ending with 3 min moist heat to L hand, working to increase circulation/decrease pain/decrease swelling at the L IF and base of this finger at the palm.  Therapeutic Activity: Objective measures taken and goals updated and reviewed for discharge note.  Reviewed pain management strategies and cumulative trauma  prevention measures to reduce triggering in L IF.  PATIENT EDUCATION: Education details: follow up with ortho upon OT d/c to assess need for another cortisone injection; progress towards goals Person educated: Patient Education method: vc Education comprehension: verbalized understanding  HOME EXERCISE PROGRAM: Ice massage, contrast bath, passive stretching for L hand digits (flex/ext/abd), theraputty, tendon gliding, use of oval 8 splint  GOALS: Goals reviewed with patient? Yes   SHORT TERM GOALS: Target date: 04/05/23 (2 weeks)  Pt will be indep to perform HEP for improving LUE flexibility and strength for daily tasks (revised on 01/28/23 to include elbow/wrist/hand). Baseline: Eval: initiated ROM exercises; 01/28/23: Pt reports inconsistent stretching over the last couple of weeks; inconsistent use of theraputty; will plan to add ulnar nerve glides; 03/25/23: pt verbalizes inconsistent stretching for the hand; 04/10/23: pt is indep with HEP but verbalizes inconsistent participation Goal status: achieved  2.  Pt will be indep to verbalize 2-3 joint protection/cumulative trauma prevention strategies to reduce pain/paresthesias in the L hand.  Baseline: Eval: initiated educ, further training needed; 01/28/23: indep with joint protection/cumulative trauma prevention strategies Goal status: achieved  LONG TERM GOALS: Target date: 04/12/23 (3 weeks)  Pt will increase FOTO score to 69 or better to indicate improvement in self perceived functional use of the L hand with daily tasks.  Baseline: Eval: 61; 01/28/23: 67; 02/11/23: 67; 03/25/23: 60 (pt verbalizes that most FOTO questions do not apply to her in regards to leisure/recreational tasks); 04/10/23: 63 Goal status: improved/partially met  2.  Pt will increase L grip strength by 15 or more lbs in order to improve ability to hold and carry ADL supplies (revised 01/28/23).   Baseline: L grip 23 lbs (R 47 lbs); 01/28/23: L grip 34 lbs (R 46 lbs); 02/11/23: L  grip 35 lbs; 03/25/23: L grip 30 lbs; 04/10/23: L grip strength 39 lbs Goal status: achieved  3.  Pt will increase L IF flexibility to enable pt to make a full composite fist to store ADL supplies without dropping. Baseline: Eval: Unable to make composite fist in L hand; L IF 4 cm from [facility]; 01/28/23: L IF 2 cm from [facility]; 03/25/23: L IF 1.5 cm from [facility]; 04/10/23: L IF 1.5 cm [facility] Goal status: improved/partially met  4.  Pt will tolerate manual therapy, therapeutic modalities, exercises, and splinting as needed to decrease pain in L hand and elbow to a reported 3/10 pain or less with activity.   Baseline: Eval: L hand pain 7/10 pain with activity (throbs); 01/28/23: 0/10 pain in L hand at rest, tender 2-3/pain with touch (base of L IF, palmar aspect), 3/10 pain in L elbow at rest, can reach 8/10 with activity; 03/25/23: L hand 3-5/10 pain with activity; 04/10/23: No reports of L elbow pain, just some numbness, 3/10 pain with activity in the L hand, no pain at rest Goal status: achieved  5. With use of compensatory strategies, pt will be able to independently and efficiently clasp an earring in her L ear. Baseline: Pt reports that with the numbness from her L elbow to the hand, clasping an  earring in L ear take extensive time and trials; 03/25/23: extensive time and trials; 04/10/23: Pt now wears hook earrings only; pt is aware of magnetic clasp jewelry, and was also advised on using a mirror for visual feedback to compensate for sensory loss.  Goal status: achieved  ASSESSMENT: CLINICAL IMPRESSION: Pt seen this date for OT d/c.  Pt has been seen by OT for 24 sessions to address L elbow and hand pain, LUE weakness, trigger finger in L IF, and joint stiffness in the hand.  Pt has been educated on cumulative trauma prevention, pain management strategies, use of oval 8 splinting (currently uses but inconsistently), HEP, and soft tissue/edema massage.  FOTO score has improved minimally from 61 at eval to 63 at  discharge.  L grip strength, composite fist, and wrist strength have all improved; see above for details.  Overall, pain in the L IF PIP and volar palm at the A1 pulley of same digit has improved, but is still present.  Pt continues to have pain with L IF triggering, but pt admits to inconsistent compliance with splinting, HEP, and pain management strategies.  Pt has been advised to follow up with ortho to assess need for another cortisone shot in the L hand.  No further skilled OT indicated at this time.   PERFORMANCE DEFICITS: in functional skills including ADLs, IADLs, coordination, dexterity, edema, ROM, strength, pain, flexibility, Fine motor control, decreased knowledge of precautions, decreased knowledge of use of DME, and UE functional use. IMPAIRMENTS: are limiting patient from ADLs, IADLs, work, and leisure.   COMORBIDITIES: has co-morbidities such as hx of tumor resection in a R sided meningioma, LUE parasthesias from possible cubital tunnel and cervical radiculopathy  that affects occupational performance. Patient will benefit from skilled OT to address above impairments and improve overall function.  MODIFICATION OR ASSISTANCE TO COMPLETE EVALUATION: No modification of tasks or assist necessary to complete an evaluation.  OT OCCUPATIONAL PROFILE AND HISTORY: Problem focused assessment: Including review of records relating to presenting problem.  CLINICAL DECISION MAKING: Moderate - several treatment options, min-mod task modification necessary  REHAB POTENTIAL: Good  EVALUATION COMPLEXITY: Moderate    PLAN:  OT FREQUENCY: 2x/week  OT DURATION: 3 additional weeks  PLANNED INTERVENTIONS: self care/ADL training, therapeutic exercise, therapeutic activity, neuromuscular re-education, manual therapy, passive range of motion, splinting, ultrasound, iontophoresis, paraffin, moist heat, cryotherapy, contrast bath, patient/family education, and DME and/or AE instructions  RECOMMENDED  OTHER SERVICES: None at this time  CONSULTED AND AGREED WITH PLAN OF CARE: Patient  PLAN FOR NEXT SESSION: N/A; d/c this visit  Danelle Earthly, MS, OTR/L  Otis Dials, OT 04/10/2023, 5:00 PM

## 2023-04-15 ENCOUNTER — Ambulatory Visit: Payer: BC Managed Care – PPO

## 2023-04-17 ENCOUNTER — Ambulatory Visit: Payer: BC Managed Care – PPO

## 2023-04-22 ENCOUNTER — Ambulatory Visit: Payer: BC Managed Care – PPO

## 2023-04-24 ENCOUNTER — Ambulatory Visit: Payer: BC Managed Care – PPO

## 2023-04-29 ENCOUNTER — Ambulatory Visit: Payer: BC Managed Care – PPO

## 2023-05-01 ENCOUNTER — Ambulatory Visit: Payer: BC Managed Care – PPO

## 2023-05-06 ENCOUNTER — Ambulatory Visit: Payer: BC Managed Care – PPO

## 2023-05-08 ENCOUNTER — Ambulatory Visit: Payer: BC Managed Care – PPO

## 2023-05-13 ENCOUNTER — Ambulatory Visit: Payer: BC Managed Care – PPO

## 2023-05-15 ENCOUNTER — Ambulatory Visit: Payer: BC Managed Care – PPO

## 2023-12-09 ENCOUNTER — Ambulatory Visit
Admission: RE | Admit: 2023-12-09 | Discharge: 2023-12-09 | Disposition: A | Discharge status: Home / Self Care | Source: Ambulatory Visit | Attending: Family Medicine | Admitting: Family Medicine

## 2023-12-09 ENCOUNTER — Other Ambulatory Visit: Payer: Self-pay | Admitting: Family Medicine

## 2023-12-09 DIAGNOSIS — G959 Disease of spinal cord, unspecified: Secondary | ICD-10-CM

## 2023-12-11 NOTE — Progress Notes (Unsigned)
 Referring Physician:  Denton Lank, FNP [address] [place],  [place] [address]  Primary Physician:  Patrice Paradise, MD  History of Present Illness: 12/12/2023 Ms. Stephanie Francis is here today with a chief complaint of numbness in her hands, specifically the first two fingers on the right hand and the last three fingers on the left hand. The numbness has been present for a significant period, with the patient noting that the numbness in the first two fingers has been present for approximately ten years. The patient denies any weakness or significant difficulty with her hands, such as opening jars, but does report minor issues with dexterity. These issues include difficulty with buttons and small items, such as earring posts, and a feeling of awkwardness when trying to tie something. The patient denies any other symptoms, including balance issues, tripping, falling, or changes in handwriting.  She has had symptoms for 2 years.  Her balance issues have slowly worsened.  Her numbness is also slowly worsened.  Bowel/Bladder Dysfunction: none  Conservative measures: seen a chiropractor around 10 years ago Physical therapy:  has not participated in PT; participated in OT without relief Multimodal medical therapy including regular antiinflammatories:  none Injections:  no epidural steroid injections  Past Surgery: 02/26/22: Meningioma Resection by Dr. Charleston Ropes has symptoms of cervical myelopathy.  The symptoms are causing a significant impact on the patient's life.   I have utilized the care everywhere function in epic to review the outside records available from external health systems.  Review of Systems:  A 10 point review of systems is negative, except for the pertinent positives and negatives detailed in the HPI.  Past Medical History: Past Medical History:  Diagnosis Date   Amenorrhea    Anxiety    Central retinal vein occlusion of right eye     Depression    GERD (gastroesophageal reflux disease)    Hypertension    Meningioma (HCC)    Obesity    Prediabetes    Seizures (HCC)    Sleep apnea     Past Surgical History: Past Surgical History:  Procedure Laterality Date   Angiolipoma removal     CHOLECYSTECTOMY     CRANIOTOMY W/EXCISION SUPRATENTORIAL MENINGIOMA     TONSILLECTOMY      Allergies: Allergies as of 12/12/2023   (No Known Allergies)    Medications:  Current Outpatient Medications:    bisoprolol-hydrochlorothiazide (ZIAC) 5-6.25 MG tablet, TAKE 1 TABLET BY MOUTH ONCE A DAY, Disp: , Rfl:    cetirizine (ZYRTEC) 10 MG tablet, Take by mouth., Disp: , Rfl:    docusate sodium (COLACE) 100 MG capsule, Take by mouth., Disp: , Rfl:    traZODone (DESYREL) 50 MG tablet, Take 50 mg by mouth at bedtime., Disp: , Rfl:   Social History: Social History   Tobacco Use   Smoking status: Former    Current packs/day: 0.00    Types: Cigarettes    Quit date: 05/03/1997    Years since quitting: 26.6   Smokeless tobacco: Never  Substance Use Topics   Alcohol use: Yes    Alcohol/week: 8.0 standard drinks of alcohol    Types: 8 Cans of beer per week    Family Medical History: Family History  Problem Relation Age of Onset   Breast cancer Neg Hx     Physical Examination: Vitals:   12/12/23 1455  BP: 110/64    General: Patient is in no apparent distress. Attention to  examination is appropriate.  Neck:   Supple.  Full range of motion.  Respiratory: Patient is breathing without any difficulty.   NEUROLOGICAL:     Awake, alert, oriented to person, place, and time.  Speech is clear and fluent.   Cranial Nerves: Pupils equal round and reactive to light.  Facial tone is symmetric.  Facial sensation is symmetric. Shoulder shrug is symmetric. Tongue protrusion is midline.  There is no pronator drift.  Strength: Side Biceps Triceps Deltoid Interossei Grip Wrist Ext. Wrist Flex.  R 5 5 5 5  4+ 5 5  L 5 5 5 5  4+ 5 5    Side Iliopsoas Quads Hamstring PF DF EHL  R 5 5 5 5 5 5   L 5 5 5 5 5 5    Reflexes are 3+ and symmetric at the biceps, triceps, brachioradialis, patella and achilles.   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   Bilateral upper and lower extremity sensation is intact to light touch.    No evidence of dysmetria noted.  Gait is wide-based.  She has severe difficulty with tandem gait.     Medical Decision Making  Imaging: MRI C spine 12/09/2023 Narrative & Impression  CLINICAL DATA:  Left hand numbness   EXAM: MRI CERVICAL SPINE WITHOUT CONTRAST   TECHNIQUE: Multiplanar, multisequence MR imaging of the cervical spine was performed. No intravenous contrast was administered.   COMPARISON:  MRI 12/25/2022   FINDINGS: Alignment: Straightening of the cervical lordosis. No significant listhesis.   Vertebrae: No fracture, evidence of discitis, or bone lesion.   Cord: Redemonstration of two areas of cord signal abnormality centered at the C4-5 and C5-6 intervertebral disc levels with areas of bilateral T2 hyperintense signal in the cord ("owl-eye" pattern). Cord is mildly atrophic at both segments. No new sites of cord signal abnormality.   Posterior Fossa, vertebral arteries, paraspinal tissues: Negative.   Disc levels:   C2-C3: Minimal disc bulge with bilateral facet arthropathy. No canal stenosis. Mild left foraminal stenosis. Unchanged.   C3-C4: Disc osteophyte complex with bilateral facet and uncovertebral arthropathy. Mild-moderate canal stenosis with moderate bilateral foraminal stenosis. No significant interval progression.   C4-C5: Disc osteophyte complex with bilateral facet and uncovertebral arthropathy. Moderate canal stenosis with moderate right and mild left foraminal stenosis. No significant interval progression.   C5-C6: Disc osteophyte complex with bilateral facet and uncovertebral arthropathy. Moderate canal stenosis with moderate-severe bilateral  foraminal stenosis. No significant interval progression.   C6-C7: Disc osteophyte complex with bilateral facet and uncovertebral arthropathy. No significant canal stenosis. Mild-moderate left foraminal stenosis. No significant interval progression.   C7-T1: Mild disc bulge with facet and uncovertebral spurring. No significant foraminal or canal stenosis. Unchanged.   IMPRESSION: 1. Redemonstration of two areas of cord signal abnormality centered at the C4-5 and C5-6 intervertebral disc levels with areas of bilateral T2 hyperintense signal in the cord ("owl-eye" pattern). Cord is mildly atrophic at both segments. This is unchanged in appearance compared to the prior MRI and may be secondary to chronic cord ischemia or chronic compressive myelomalacia. 2. Multilevel cervical spondylosis, not significantly progressed from prior. 3. Moderate canal stenosis at C4-5 and C5-6. Mild-to-moderate canal stenosis at C3-4. 4. Multilevel foraminal stenosis, moderate-severe bilaterally at C5-6.     Electronically Signed   By: Duanne Guess D.O.   On: 12/09/2023 19:53    I have personally reviewed the images and agree with the above interpretation.  Assessment and Plan: Ms. Marchant is a pleasant 58 y.o. female with  cervical myelopathy due to severe cervical stenosis from C4-C6 causing myelomalacia.  Cervical Myelopathy with Myelomalacia MRI confirmed significant spinal cord compression at two levels with myelomalacia due to arthritis and disc bulges. Despite mild symptoms, there is a risk of progression to severe dysfunction. Surgery is recommended to prevent further deterioration as conservative treatments are ineffective. She is at increased risk of spinal cord injury with minor trauma. - Offer ACDF C4-6 to prevent further functional deterioration. - Discuss surgical risks, including bleeding, infection, and potential need for future neck surgery. - Discuss common post-operative issues  such as swallowing difficulty and rare voice changes. - Recommend follow-up imaging if surgery is delayed beyond 12 months. - Schedule follow-up appointment in 2-3 months to reassess symptoms and discuss surgical timing.  I discussed the planned procedure at length with the patient, including the risks, benefits, alternatives, and indications. The risks discussed include but are not limited to bleeding, infection, need for reoperation, spinal fluid leak, stroke, vision loss, anesthetic complication, coma, paralysis, and even death. We also discussed the possibility of post-operative dysphagia, vocal cord paralysis, and the risk of adjacent segment disease in the future. I also described in detail that improvement was not guaranteed.  The patient expressed understanding of these risks. I described the surgery in layman's terms, and gave ample opportunity for questions, which were answered to the best of my ability.  She is not ready to proceed, but we will schedule additional follow-up.  I have stressed that she should let me know should her symptoms worsen.  I spent a total of 45 minutes in this patient's care today. This time was spent reviewing pertinent records including imaging studies, obtaining and confirming history, performing a directed evaluation, formulating and discussing my recommendations, and documenting the visit within the medical record.     Thank you for involving me in the care of this patient.      Glendon Fiser K. Myer Haff MD, [facility] Neurosurgery

## 2023-12-12 ENCOUNTER — Ambulatory Visit: Admitting: Neurosurgery

## 2023-12-12 ENCOUNTER — Encounter: Payer: Self-pay | Admitting: Neurosurgery

## 2023-12-12 VITALS — BP 110/64 | Ht 66.0 in | Wt 249.2 lb

## 2023-12-12 DIAGNOSIS — G959 Disease of spinal cord, unspecified: Secondary | ICD-10-CM

## 2023-12-12 DIAGNOSIS — G9589 Other specified diseases of spinal cord: Secondary | ICD-10-CM

## 2023-12-12 DIAGNOSIS — M4802 Spinal stenosis, cervical region: Secondary | ICD-10-CM

## 2023-12-18 ENCOUNTER — Telehealth: Payer: Self-pay | Admitting: Neurosurgery

## 2023-12-18 DIAGNOSIS — G9589 Other specified diseases of spinal cord: Secondary | ICD-10-CM

## 2023-12-18 DIAGNOSIS — M4802 Spinal stenosis, cervical region: Secondary | ICD-10-CM

## 2023-12-18 DIAGNOSIS — G959 Disease of spinal cord, unspecified: Secondary | ICD-10-CM

## 2023-12-18 NOTE — Telephone Encounter (Signed)
 I spoke with Stephanie Francis. She requested to wait until the end of April for surgery. We discussed the following.  Planned surgery: C4-6 anterior cervical discectomy and fusion   Surgery date: 01/22/24 at [facility]: [address] [place], [place] [address]) - you will find out your arrival time the business day before your surgery.   Pre-op appointment at [facility] Pre-admit Testing: you will receive a call with a date/time for this appointment. If you are scheduled for an in person appointment, Pre-admit Testing is located on the first floor of the [facility] building, [address], [address]. During this appointment, they will advise you which medications you can take the morning of surgery, and which medications you will need to hold for surgery. Labs (such as blood work, EKG) may be done at your pre-op appointment. You are not required to fast for these labs. Should you need to change your pre-op appointment, please call Pre-admit testing at [phone].      NSAIDS (Non-steroidal anti-inflammatory drugs): because you are having a fusion, please avoid taking any NSAIDS (examples: ibuprofen, motrin, aleve, naproxen, meloxicam, diclofenac) for 3 months after surgery. Celebrex is an exception and is OK to take, if prescribed. Tylenol is not an NSAID.    Common restrictions after surgery: No bending, lifting, or twisting ("BLT"). Avoid lifting objects heavier than 10 pounds for the first 6 weeks after surgery. Where possible, avoid household activities that involve lifting, bending, reaching, pushing, or pulling such as laundry, vacuuming, grocery shopping, and childcare. Try to arrange for help from friends and family for these activities while you heal. Do not drive while taking prescription pain medication. Weeks 6 through 12 after surgery: avoid lifting more than 25 pounds.    X-rays after surgery: Because you are having a fusion: for  appointments after your 2 week follow-up: please arrive at the [facility] outpatient imaging center ([address], [place]) or [facility] one hour prior to your appointment for x-rays. This applies to every appointment after your 2 week follow-up. Failure to do so may result in your appointment being rescheduled.   How to contact us:  If you have any questions/concerns before or after surgery, you can reach [place] at [phone], or you can send a mychart message. We can be reached by phone or mychart 8am-4pm, Monday-Friday.  *Please note: Calls after 4pm are forwarded to a third party answering service. Mychart messages are not routinely monitored during evenings, weekends, and holidays. Please call our office to contact the answering service for urgent concerns during non-business hours.    If you have FMLA/disability paperwork, please drop it off or fax it to [phone], attention Patty.   Appointments/FMLA & disability paperwork: Joycelyn Rua, & Flonnie Hailstone Registered Nurses/Surgery schedulers: Ilsa Iha Medical Assistants: Nash Mantis Physician Assistants: Joan Flores, PA-C, Manning Charity, PA-C & Drake Leach, PA-C Surgeons: Venetia Night, MD & Ernestine Mcmurray, MD   [facility] PREADMIT TESTING VISIT and SURGERY INFORMATION SHEET   Now that surgery has been scheduled you can anticipate several phone calls from [facility] services. A pharmacy technician will call you to verify your current list of medications taken at home.               The Pre-Service Center will call to verify your insurance information and to give you billing estimates and information.  The Preadmit Testing Office will be calling to schedule a visit to obtain information for the anesthesia team and provide instructions on preparation for surgery.  What can you expect for the Preadmit Testing Visit: Appointments may be scheduled  in-person or by telephone.  If a telephone visit is scheduled, you may be asked to come into the office to have lab tests or other studies performed.   This visit will not be completed any greater than 14 days prior to your surgery.  If your surgery has been scheduled for a future date, please do not be alarmed if we have not contacted you to schedule an appointment more than a month prior to the surgery date.    Please be prepared to provide the following information during this appointment:            -Personal medical history                                               -Medication and allergy list            -Any history of problems with anesthesia              -Recent lab work or diagnostic studies            -Please notify us of any needs we should be aware of to provide the best care possible           -You will be provided with instructions on how to prepare for your surgery.    On The Day of Surgery:  You must have a driver to take you home after surgery, you will be asked not to drive for 24 hours following surgery.  Taxi, Benedetto Goad and non-medical transport will not be acceptable means of transportation unless you have a responsible individual who will be traveling with you.  Visitors in the surgical area:   2 people will be able to visit you in your room once your preparation for surgery has been completed. During surgery, your visitors will be asked to wait in the Surgery Waiting Area.  It is not a requirement for them to stay, if they prefer to leave and come back.  Your visitor(s) will be given an update once the surgery has been completed.  No visitors are allowed in the initial recovery room to respect patient privacy and safety.  Once you are more awake and transfer to the secondary recovery area, or are transferred to an inpatient room, visitors will again be able to see you.  To respect and protect your privacy: We will ask on the day of surgery who your driver will be and what  the contact number for that individual will be. We will ask if it is okay to share information with this individual, or if there is an alternative individual that we, or the surgeon, should contact to provide updates and information. If family or friends come to the surgical information desk requesting information about you, who you have not listed with [place], no information will be given.   It may be helpful to designate someone as the main contact who will be responsible for updating your other friends and family.    PREADMIT TESTING OFFICE: [phone] SAME DAY SURGERY: [phone] We look forward to caring for you before and throughout the process of your surgery.

## 2023-12-18 NOTE — Telephone Encounter (Signed)
 Patient called the office stating she would like to proceed with surgery and would like to discuss upcoming dates with the provider. Please advise

## 2023-12-19 ENCOUNTER — Other Ambulatory Visit: Payer: Self-pay

## 2023-12-19 DIAGNOSIS — Z01818 Encounter for other preprocedural examination: Secondary | ICD-10-CM

## 2024-01-08 ENCOUNTER — Other Ambulatory Visit: Payer: Self-pay | Admitting: Physician Assistant

## 2024-01-08 DIAGNOSIS — Z1231 Encounter for screening mammogram for malignant neoplasm of breast: Secondary | ICD-10-CM

## 2024-01-13 ENCOUNTER — Other Ambulatory Visit

## 2024-01-14 ENCOUNTER — Other Ambulatory Visit

## 2024-01-14 ENCOUNTER — Telehealth: Payer: Self-pay

## 2024-01-14 ENCOUNTER — Encounter
Admission: RE | Admit: 2024-01-14 | Discharge: 2024-01-14 | Disposition: A | Discharge status: Home / Self Care | Source: Ambulatory Visit | Attending: Neurosurgery | Admitting: Neurosurgery

## 2024-01-14 DIAGNOSIS — Z049 Encounter for examination and observation for unspecified reason: Secondary | ICD-10-CM

## 2024-01-14 HISTORY — DX: Essential (primary) hypertension: I10

## 2024-01-14 HISTORY — DX: Radiculopathy, cervical region: M54.12

## 2024-01-14 NOTE — Patient Instructions (Addendum)
 Your procedure is scheduled on: Wednesday 01/22/24 Report to the Registration Desk on the 1st floor of the [facility]. To find out your arrival time, please call [phone] between 1PM - 3PM on: Tuesday 01/21/24 If your arrival time is 6:00 am, do not arrive before that time as the [facility] entrance doors do not open until 6:00 am.  REMEMBER: Instructions that are not followed completely may result in serious medical risk, up to and including death; or upon the discretion of your surgeon and anesthesiologist your surgery may need to be rescheduled.  Do not eat food after midnight the night before surgery.  No gum chewing or hard candies.  You may however, drink CLEAR liquids up to 2 hours before you are scheduled to arrive for your surgery. Do not drink anything within 2 hours of your scheduled arrival time.  Clear liquids include: - water  - apple juice without pulp - gatorade (not RED colors) - black coffee or tea (Do NOT add milk or creamers to the coffee or tea) Do NOT drink anything that is not on this list.   One week prior to surgery: Stop ANY OVER THE COUNTER supplements until after surgery.  You may however, continue to take Tylenol  if needed for pain up until the day of surgery.  **Follow guidelines for insulin and diabetes medications.**  **Follow recommendations regarding stopping blood thinners.**  Continue taking all of your other prescription medications up until the day of surgery.  ON THE DAY OF SURGERY DO NOT TAKE ANY MEDICATIONS   No Alcohol for 24 hours before or after surgery.  No Smoking including e-cigarettes for 24 hours before surgery.  No chewable tobacco products for at least 6 hours before surgery.  No nicotine patches on the day of surgery.  Do not use any "recreational" drugs for at least a week (preferably 2 weeks) before your surgery.  Please be advised that the combination of cocaine and anesthesia may have negative outcomes, up to and  including death. If you test positive for cocaine, your surgery will be cancelled.  On the morning of surgery brush your teeth with toothpaste and water, you may rinse your mouth with mouthwash if you wish. Do not swallow any toothpaste or mouthwash.  Use CHG Soap as directed on instruction sheet.  Do not wear jewelry, make-up, hairpins, clips or nail polish.  For welded (permanent) jewelry: bracelets, anklets, waist bands, etc.  Please have this removed prior to surgery.  If it is not removed, there is a chance that hospital personnel will need to cut it off on the day of surgery.  Do not wear lotions, powders, or perfumes.   Do not shave body hair from the neck down 48 hours before surgery.  Contact lenses, hearing aids and dentures may not be worn into surgery.  Do not bring valuables to the hospital. [facility] is not responsible for any missing/lost belongings or valuables.   Bring your C-PAP to the hospital in case you may have to spend the night.   Notify your doctor if there is any change in your medical condition (cold, fever, infection).  Wear comfortable clothing (specific to your surgery type) to the hospital.  After surgery, you can help prevent lung complications by doing breathing exercises.  Take deep breaths and cough every 1-2 hours.   If you are being discharged the day of surgery, you will not be allowed to drive home. You will need a responsible individual to drive you  home and stay with you for 24 hours after surgery.   If you are taking public transportation, you will need to have a responsible individual with you.  Please call the Pre-admissions Testing Dept. at [phone] if you have any questions about these instructions.  Surgery Visitation Policy:  Patients having surgery or a procedure may have two visitors.  Children under the age of 89 must have an adult with them who is not the patient.      Pre-operative 5 CHG Bath Instructions    You can play a key role in reducing the risk of infection after surgery. Your skin needs to be as free of germs as possible. You can reduce the number of germs on your skin by washing with CHG (chlorhexidine gluconate) soap before surgery. CHG is an antiseptic soap that kills germs and continues to kill germs even after washing.   DO NOT use if you have an allergy to chlorhexidine/CHG or antibacterial soaps. If your skin becomes reddened or irritated, stop using the CHG and notify one of our RNs at [phone].   Please shower with the CHG soap starting 4 days before surgery using the following schedule:     Please keep in mind the following:  DO NOT shave, including legs and underarms, starting the day of your first shower.   You may shave your face at any point before/day of surgery.  Place clean sheets on your bed the day you start using CHG soap. Use a clean washcloth (not used since being washed) for each shower. DO NOT sleep with pets once you start using the CHG.   CHG Shower Instructions:  If you choose to wash your hair and private area, wash first with your normal shampoo/soap.  After you use shampoo/soap, rinse your hair and body thoroughly to remove shampoo/soap residue.  Turn the water OFF and apply about 3 tablespoons (45 ml) of CHG soap to a CLEAN washcloth.  Apply CHG soap ONLY FROM YOUR NECK DOWN TO YOUR TOES (washing for 3-5 minutes)  DO NOT use CHG soap on face, private areas, open wounds, or sores.  Pay special attention to the area where your surgery is being performed.  If you are having back surgery, having someone wash your back for you may be helpful. Wait 2 minutes after CHG soap is applied, then you may rinse off the CHG soap.  Pat dry with a clean towel  Put on clean clothes/pajamas   If you choose to wear lotion, please use ONLY the CHG-compatible lotions on the back of this paper.     Additional instructions for the day of surgery: DO NOT APPLY any  lotions, deodorants, cologne, or perfumes.   Put on clean/comfortable clothes.  Brush your teeth.  Ask your nurse before applying any prescription medications to the skin.      CHG Compatible Lotions   Aveeno Moisturizing lotion  Cetaphil Moisturizing Cream  Cetaphil Moisturizing Lotion  Clairol Herbal Essence Moisturizing Lotion, Dry Skin  Clairol Herbal Essence Moisturizing Lotion, Extra Dry Skin  Clairol Herbal Essence Moisturizing Lotion, Normal Skin  Curel Age Defying Therapeutic Moisturizing Lotion with Alpha Hydroxy  Curel Extreme Care Body Lotion  Curel Soothing Hands Moisturizing Hand Lotion  Curel Therapeutic Moisturizing Cream, Fragrance-Free  Curel Therapeutic Moisturizing Lotion, Fragrance-Free  Curel Therapeutic Moisturizing Lotion, Original Formula  Eucerin Daily Replenishing Lotion  Eucerin Dry Skin Therapy Plus Alpha Hydroxy Crme  Eucerin Dry Skin Therapy Plus Alpha Hydroxy Lotion  Eucerin Original Crme  Eucerin Original Lotion  Eucerin Plus Crme Eucerin Plus Lotion  Eucerin TriLipid Replenishing Lotion  Keri Anti-Bacterial Hand Lotion  Keri Deep Conditioning Original Lotion Dry Skin Formula Softly Scented  Keri Deep Conditioning Original Lotion, Fragrance Free Sensitive Skin Formula  Keri Lotion Fast Absorbing Fragrance Free Sensitive Skin Formula  Keri Lotion Fast Absorbing Softly Scented Dry Skin Formula  Keri Original Lotion  Keri Skin Renewal Lotion Keri Silky Smooth Lotion  Keri Silky Smooth Sensitive Skin Lotion  Nivea Body Creamy Conditioning Oil  Nivea Body Extra Enriched Teacher, adult education Moisturizing Lotion Nivea Crme  Nivea Skin Firming Lotion  NutraDerm 30 Skin Lotion  NutraDerm Skin Lotion  NutraDerm Therapeutic Skin Cream  NutraDerm Therapeutic Skin Lotion  ProShield Protective Hand Cream  Provon moisturizing lotion

## 2024-01-14 NOTE — Progress Notes (Signed)
 Patient had seizure 4/22. Spoke to son, stating she is being admitted to [facility] hospital for evaluation. Rescheduled preop phone call for Friday 4/25 to reevaluate.

## 2024-01-14 NOTE — Telephone Encounter (Signed)
 Received secure chat from [place], [place] at 3:03pm today. Dr Mont Antis was added to the chat.  She has a history of seizures and a history of surgery for a brain tumor. Last seizure prior to today unknown per ER notes.  Pt is still in ED at this time. Surgery is scheduled for 01/22/24. Will continue to follow.

## 2024-01-15 ENCOUNTER — Telehealth: Payer: Self-pay | Admitting: Neurosurgery

## 2024-01-15 ENCOUNTER — Inpatient Hospital Stay
Admission: RE | Admit: 2024-01-15 | Discharge: 2024-01-15 | Disposition: A | Discharge status: Home / Self Care | Payer: Self-pay | Source: Ambulatory Visit | Attending: Neurosurgery | Admitting: Neurosurgery

## 2024-01-15 DIAGNOSIS — Z049 Encounter for examination and observation for unspecified reason: Secondary | ICD-10-CM

## 2024-01-15 NOTE — Telephone Encounter (Signed)
 Images have been loaded to chart. There is another telephone call from 01/15/24 (the patient also called about this), which I have routed to Dr Mont Antis for review.

## 2024-01-15 NOTE — Telephone Encounter (Signed)
 Sent another powershare request to [facility] 01/14/24: CT brain, xray chest

## 2024-01-15 NOTE — Telephone Encounter (Signed)
 Images have been loaded to chart. Reports are in Care Everywhere. Neurology saw her as a consult while in the [facility] ER. Per the end of their note:  "Patient remains at risk for seizure despite resection of her frontal meningioma given her resection cavity, which still acts as a nidus for seizures. As such, it would be prudent to resume antiseizure therapy. After discussion with patient, she is amenable to restarting Keppra  at a dose of 750 mg BID....  RECOMMENDATIONS: - Start Keppra  750 mg BID - We will notify patient's outpatient neurologist of her ED presentation - No further neurologic workup necessary at this stage; Neurology will sign off "

## 2024-01-15 NOTE — Telephone Encounter (Signed)
 Patient was discharged from [facility]. CT head showed no abnormality. Started on Keppra  750mg  BID. Will need to see if patient is optimized for surgery planned next week.

## 2024-01-15 NOTE — Telephone Encounter (Signed)
 Patient's images have been requested from [facility]. We are reviewing her condition to present to Dr. Mont Antis to determine if she is safe to proceed with surgery. Once reviewed we will contact the patient with a decision.

## 2024-01-15 NOTE — Telephone Encounter (Signed)
 Sent powershare request to [facility] 01/14/24: CT brain, xray chest

## 2024-01-15 NOTE — Telephone Encounter (Signed)
 Another request sent to [facility] for images. Given the sensitive time sensitivity of this matter, it was sent this time STAT.

## 2024-01-15 NOTE — Telephone Encounter (Signed)
 Patient is calling to let our office know that she had a seizure at work yesterday and had to be taken by ambulance to [facility]. She states that she had a Meningoma two years ago and does not know if this could be something that contributed to her seizure. She states that she will be back on Keppra  for seizures and wants to know if this will interfere with her upcoming surgery.

## 2024-01-16 NOTE — Telephone Encounter (Signed)
 I notified the patient of Dr Chrystal Crape response and asked her to contact us  if she has another seizure prior to surgery. She also plans to contact Dr Mason Sole to schedule an appointment with him to discuss her recurrent seizures.

## 2024-01-17 ENCOUNTER — Encounter
Admission: RE | Admit: 2024-01-17 | Discharge: 2024-01-17 | Disposition: A | Discharge status: Home / Self Care | Source: Ambulatory Visit | Attending: Neurosurgery | Admitting: Neurosurgery

## 2024-01-17 ENCOUNTER — Other Ambulatory Visit: Payer: Self-pay

## 2024-01-17 DIAGNOSIS — Z01818 Encounter for other preprocedural examination: Secondary | ICD-10-CM | POA: Diagnosis present

## 2024-01-17 DIAGNOSIS — I10 Essential (primary) hypertension: Secondary | ICD-10-CM | POA: Insufficient documentation

## 2024-01-17 DIAGNOSIS — Z01810 Encounter for preprocedural cardiovascular examination: Secondary | ICD-10-CM | POA: Diagnosis not present

## 2024-01-17 DIAGNOSIS — R7303 Prediabetes: Secondary | ICD-10-CM | POA: Insufficient documentation

## 2024-01-17 HISTORY — DX: Obstructive sleep apnea (adult) (pediatric): G47.33

## 2024-01-17 HISTORY — DX: Personal history of nicotine dependence: Z87.891

## 2024-01-17 HISTORY — DX: Type 2 diabetes mellitus without complications: E11.9

## 2024-01-17 LAB — TYPE AND SCREEN
ABO/RH(D): A POS
Antibody Screen: NEGATIVE

## 2024-01-17 LAB — SURGICAL PCR SCREEN
MRSA, PCR: NEGATIVE
Staphylococcus aureus: NEGATIVE

## 2024-01-17 NOTE — Patient Instructions (Addendum)
 Your procedure is scheduled on:01-22-24 Wednesday Report to the Registration Desk on the 1st floor of the Medical Mall.Then proceed to the 2nd floor Surgery Desk To find out your arrival time, please call [phone] between 1PM - 3PM on:01-21-24 Tuesday If your arrival time is 6:00 am, do not arrive before that time as the Medical Mall entrance doors do not open until 6:00 am.  REMEMBER: Instructions that are not followed completely may result in serious medical risk, up to and including death; or upon the discretion of your surgeon and anesthesiologist your surgery may need to be rescheduled.  Do not eat food after midnight the night before surgery.  No gum chewing or hard candies.  You may however, drink Water up to 2 hours before you are scheduled to arrive for your surgery. Do not drink anything within 2 hours of your scheduled arrival time.  One week prior to surgery:Stop NOW (01-17-24) Stop ANY OVER THE COUNTER supplements until after surgery (Vitamin D3, Magnesium, Multivitamin)  Continue taking all of your other prescription medications up until the day of surgery.  ON THE DAY OF SURGERY ONLY TAKE THESE MEDICATIONS WITH SIPS OF WATER: -levETIRAcetam  (KEPPRA )   No Alcohol for 24 hours before or after surgery.  No Smoking including e-cigarettes for 24 hours before surgery.  No chewable tobacco products for at least 6 hours before surgery.  No nicotine patches on the day of surgery.  Do not use any "recreational" drugs for at least a week (preferably 2 weeks) before your surgery.  Please be advised that the combination of cocaine and anesthesia may have negative outcomes, up to and including death. If you test positive for cocaine, your surgery will be cancelled.  On the morning of surgery brush your teeth with toothpaste and water, you may rinse your mouth with mouthwash if you wish. Do not swallow any toothpaste or mouthwash.  Use CHG Soap as directed on instruction  sheet.  Do not wear jewelry, make-up, hairpins, clips or nail polish.  For welded (permanent) jewelry: bracelets, anklets, waist bands, etc.  Please have this removed prior to surgery.  If it is not removed, there is a chance that hospital personnel will need to cut it off on the day of surgery.  Do not wear lotions, powders, or perfumes.   Do not shave body hair from the neck down 48 hours before surgery.  Contact lenses, hearing aids and dentures may not be worn into surgery.  Do not bring valuables to the hospital. [facility] is not responsible for any missing/lost belongings or valuables.    Notify your doctor if there is any change in your medical condition (cold, fever, infection).  Wear comfortable clothing (specific to your surgery type) to the hospital.  After surgery, you can help prevent lung complications by doing breathing exercises.  Take deep breaths and cough every 1-2 hours. Your doctor may order a device called an Incentive Spirometer to help you take deep breaths. When coughing or sneezing, hold a pillow firmly against your incision with both hands. This is called "splinting." Doing this helps protect your incision. It also decreases belly discomfort.  If you are being admitted to the hospital overnight, leave your suitcase in the car. After surgery it may be brought to your room.  In case of increased patient census, it may be necessary for you, the patient, to continue your postoperative care in the Same Day Surgery department.  If you are being discharged the day of surgery,  you will not be allowed to drive home. You will need a responsible individual to drive you home and stay with you for 24 hours after surgery.   If you are taking public transportation, you will need to have a responsible individual with you.  Please call the Pre-admissions Testing Dept. at [phone] if you have any questions about these instructions.  Surgery Visitation  Policy:  Patients having surgery or a procedure may have two visitors.  Children under the age of 71 must have an adult with them who is not the patient.    Pre-operative 5 CHG Bath Instructions   You can play a key role in reducing the risk of infection after surgery. Your skin needs to be as free of germs as possible. You can reduce the number of germs on your skin by washing with CHG (chlorhexidine gluconate) soap before surgery. CHG is an antiseptic soap that kills germs and continues to kill germs even after washing.   DO NOT use if you have an allergy to chlorhexidine/CHG or antibacterial soaps. If your skin becomes reddened or irritated, stop using the CHG and notify one of our RNs at [phone].   Please shower with the CHG soap starting 4 days before surgery using the following schedule:     Please keep in mind the following:  DO NOT shave, including legs and underarms, starting the day of your first shower.   You may shave your face at any point before/day of surgery.  Place clean sheets on your bed the day you start using CHG soap. Use a clean washcloth (not used since being washed) for each shower. DO NOT sleep with pets once you start using the CHG.   CHG Shower Instructions:  If you choose to wash your hair and private area, wash first with your normal shampoo/soap.  After you use shampoo/soap, rinse your hair and body thoroughly to remove shampoo/soap residue.  Turn the water OFF and apply about 3 tablespoons (45 ml) of CHG soap to a CLEAN washcloth.  Apply CHG soap ONLY FROM YOUR NECK DOWN TO YOUR TOES (washing for 3-5 minutes)  DO NOT use CHG soap on face, private areas, open wounds, or sores.  Pay special attention to the area where your surgery is being performed.  If you are having back surgery, having someone wash your back for you may be helpful. Wait 2 minutes after CHG soap is applied, then you may rinse off the CHG soap.  Pat dry with a clean towel  Put on  clean clothes/pajamas   If you choose to wear lotion, please use ONLY the CHG-compatible lotions on the back of this paper.     Additional instructions for the day of surgery: DO NOT APPLY any lotions, deodorants, cologne, or perfumes.   Put on clean/comfortable clothes.  Brush your teeth.  Ask your nurse before applying any prescription medications to the skin.      CHG Compatible Lotions   Aveeno Moisturizing lotion  Cetaphil Moisturizing Cream  Cetaphil Moisturizing Lotion  Clairol Herbal Essence Moisturizing Lotion, Dry Skin  Clairol Herbal Essence Moisturizing Lotion, Extra Dry Skin  Clairol Herbal Essence Moisturizing Lotion, Normal Skin  Curel Age Defying Therapeutic Moisturizing Lotion with Alpha Hydroxy  Curel Extreme Care Body Lotion  Curel Soothing Hands Moisturizing Hand Lotion  Curel Therapeutic Moisturizing Cream, Fragrance-Free  Curel Therapeutic Moisturizing Lotion, Fragrance-Free  Curel Therapeutic Moisturizing Lotion, Original Formula  Eucerin Daily Replenishing Lotion  Eucerin Dry Skin Therapy Plus Alpha  Hydroxy Crme  Eucerin Dry Skin Therapy Plus Alpha Hydroxy Lotion  Eucerin Original Crme  Eucerin Original Lotion  Eucerin Plus Crme Eucerin Plus Lotion  Eucerin TriLipid Replenishing Lotion  Keri Anti-Bacterial Hand Lotion  Keri Deep Conditioning Original Lotion Dry Skin Formula Softly Scented  Keri Deep Conditioning Original Lotion, Fragrance Free Sensitive Skin Formula  Keri Lotion Fast Absorbing Fragrance Free Sensitive Skin Formula  Keri Lotion Fast Absorbing Softly Scented Dry Skin Formula  Keri Original Lotion  Keri Skin Renewal Lotion Keri Silky Smooth Lotion  Keri Silky Smooth Sensitive Skin Lotion  Nivea Body Creamy Conditioning Oil  Nivea Body Extra Enriched Teacher, adult education Moisturizing Lotion Nivea Crme  Nivea Skin Firming Lotion  NutraDerm 30 Skin Lotion  NutraDerm Skin Lotion  NutraDerm  Therapeutic Skin Cream  NutraDerm Therapeutic Skin Lotion  ProShield Protective Hand Cream  Provon moisturizing lotion

## 2024-01-17 NOTE — Progress Notes (Signed)
 Called pt to do her anesthesia interview for upcoming surgery with Dr Mont Antis. Pt states that she has not followed up with Dr Mason Sole since having a seizure at work on 4-22 stating that she probably cannot get in to be seen before her surgery. I informed pt that pt needs to reach out to his office ASAP as anesthesia will need clearance from Dr Mason Sole. Pt verbalized she would call as soon as she got off the phone.  Pt also states that she has some chest congestion,is wheezing and has yellow nasal drainage as this has been going on since 01-08-24. Denies fever. Pt just saw PA Mimi McLaughlin on 4-16 and was having congestion then as was documented in Mimi's note. Pt is not getting any better and I informed pt she needs to call Mimi's office ASAP today as well to see if maybe she can be put on an ABX so surgery is not delayed. Pt states she will call the office today as well. Will relay this information to Renate Caroline NP and to Dr Chrystal Crape office.

## 2024-01-20 NOTE — Progress Notes (Signed)
 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 Bettylou Brunner, MD.  During her PAT interview, patient pausing that she was recently seen in the ED for a recurrent seizure.  Notes from the ED dated 01/14/2024 were reviewed.  Of note, patient is status post a 5 cm RIGHT frontal meningioma removal back in 01/2022. During her most recent visit to the ED, neurology was consulted.  Neurology advising that given her resection cavity, which acts as a nidus procedures, patient remained at risk for recurrent seizures.  She was therefore restarted on levetiracetam  750 mg twice daily and advised to follow-up with her outpatient neurologist.  No other further neurological testing was deemed necessary at that time.  Primary attending surgeon has been consulted regarding the aforementioned recent seizure activity.  Per Dr. Jeris Montes, if patient does not experience any further seizures between now and the time of surgery, he is amenable to proceeding with surgical intervention as planned.  Patient presented to the PAT clinic on 01/17/2024 for preoperative labs and ECG.  During her time in the PAT department, patient with reports of confusion, increased somnolence and fatigue status post her recent seizure.   Additionally, patient with upper respiratory tract infection symptoms that have been persistent for several weeks.  She had recently been seen by her PCP and started on azithromycin x 5 days.  Patient advising that cognitive issues and fatigue have been experienced in the past with use of levetiracetam . She discussed that she had plans to see her neurologist Mason Sole, MD)  on Monday 01/20/2024.  Patient seen in outpatient consult on 01/20/2024 by neurology; notes reviewed.  Patient stable overall.  Discussed cognitive changes and fatigue. Patient's levetiracetam  dosing schedule was decreased from 750 mg twice daily to 500 mg in the morning and 250 mg at bedtime.  Plans are to keep patient on 750 mg daily dose x 1 month, with plans for down titration of the dose after that if patient does not experience any further seizure activity.  Patient cleared by neurology to proceed with planned surgical intervention with neurosurgery at a MODERATE, but acceptable, risk for perioperative complications.  Impression and Plan:  Stephanie Francis scheduled for upcoming elective ACDF on 01/22/2024.  Patient experienced recurrent seizure on 01/14/2024.  Additionally, patient is on oral antimicrobial therapy (azithromycin) course for what sounds like sinusitis symptoms.  All of the aforementioned has been discussed with patient's primary attending surgeon.  Benefits versus risk of proceeding have been considered.  Despite her recent seizure and current URI symptoms, assuming that patient does not worsen between now and the time of her procedure, surgeon has deemed patient acceptable to proceed as planned.    I have discussed this patient's past medical  history, recent seizure activity, and URI symptoms with attending anesthesiologist Lincoln Renshaw, MD) who agrees that if patient remains seizure-free and has clinically improved from her current URI, she should be okay to proceed with planned surgical intervention.  No further needs from  the PAT department identified at this time.  Renate Caroline, MSN, APRN, FNP-C, CEN [facility]  Perioperative Services Nurse Practitioner Phone: [phone] Fax: [phone] 01/20/24 1:47 PM  NOTE: This note has been prepared using Dragon dictation software. Despite my best ability to proofread, there is always the potential that unintentional transcriptional errors may still occur from this process.

## 2024-01-21 NOTE — Anesthesia Preprocedure Evaluation (Signed)
 Anesthesia Evaluation  Patient identified by MRN, date of birth, ID band Patient awake    Reviewed: Allergy & Precautions, H&P , NPO status , Patient's Chart, lab work & pertinent test results  Airway Mallampati: III  TM Distance: >3 FB Neck ROM: full    Dental no notable dental hx.    Pulmonary sleep apnea , former smoker   Pulmonary exam normal        Cardiovascular hypertension, Normal cardiovascular exam     Neuro/Psych Seizures - (last seizure on 01-14-24), Poorly Controlled,  PSYCHIATRIC DISORDERS  Depression     5 cm RIGHT frontal; s/p resection 02/26/2022; per neurology "remains at risk for seizure despite resection of her frontal meningioma given her resection cavity, which still acts as a nidus for seizures"  Neuromuscular disease    GI/Hepatic Neg liver ROS,GERD  Controlled,,  Endo/Other  diabetes, Well Controlled, Type 2    Renal/GU      Musculoskeletal   Abdominal  (+) + obese  Peds  Hematology negative hematology ROS (+)   Anesthesia Other Findings PER Stephanie Francis PAT NOTE: During her PAT interview, patient pausing that she was recently seen in the ED for a recurrent seizure.  Notes from the ED dated 01/14/2024 were reviewed.  Of note, patient is status post a 5 cm RIGHT frontal meningioma removal back in 01/2022. During her most recent visit to the ED, neurology was consulted.  Neurology advising that given her resection cavity, which acts as a nidus procedures, patient remained at risk for recurrent seizures.  She was therefore restarted on levetiracetam  750 mg twice daily and advised to follow-up with her outpatient neurologist.  No other further neurological testing was deemed necessary at that time.   Primary attending surgeon has been consulted regarding the aforementioned recent seizure activity.  Per Dr. Jeris Francis, if patient does not experience any further seizures between now and the time of surgery, he  is amenable to proceeding with surgical intervention as planned.  Patient presented to the PAT clinic on 01/17/2024 for preoperative labs and ECG.  During her time in the PAT department, patient with reports of confusion, increased somnolence and fatigue status post her recent seizure.  Additionally, patient with upper respiratory tract infection symptoms that have been persistent for several weeks.  She had recently been seen by her PCP and started on azithromycin x 5 days.  Patient advising that cognitive issues and fatigue have been experienced in the past with use of levetiracetam . She discussed that she had plans to see her neurologist Stephanie Sole, MD)  on Monday 01/20/2024.   Patient seen in outpatient consult on 01/20/2024 by neurology; notes reviewed.  Patient stable overall.  Discussed cognitive changes and fatigue. Patient's levetiracetam  dosing schedule was decreased from 750 mg twice daily to 500 mg in the morning and 250 mg at bedtime.  Plans are to keep patient on 750 mg daily dose x 1 month, with plans for down titration of the dose after that if patient does not experience any further seizure activity.  Patient cleared by neurology to proceed with planned surgical intervention with neurosurgery at a MODERATE, but acceptable, risk for perioperative complications.    Past Medical History: No date: Amenorrhea No date: Anxiety No date: Central retinal vein occlusion of right eye No date: Cervical radiculopathy No date: Depression No date: Diet-controlled type 2 diabetes mellitus (HCC) No date: Essential hypertension No date: Former smoker No date: GERD (gastroesophageal reflux disease) No date: Meningioma (HCC)  Comment:  a.) 5 cm RIGHT frontal; s/p resection 02/26/2022; per               neurology "remains at risk for seizure despite resection               of her frontal meningioma given her resection cavity,               which still acts as a nidus for seizures" No date: Obesity No  date: OSA (obstructive sleep apnea)     Comment:  a.) unable to tolerate nocturnal PAP therapy No date: Seizures (HCC)     Comment:  last seizure on 01-14-24  Past Surgical History: No date: Angiolipoma removal No date: CHOLECYSTECTOMY 02/26/2022: CRANIOTOMY W/EXCISION SUPRATENTORIAL MENINGIOMA No date: TONSILLECTOMY     Reproductive/Obstetrics negative OB ROS                             Anesthesia Physical Anesthesia Plan  ASA: 3  Anesthesia Plan: General ETT   Post-op Pain Management: Tylenol  PO (pre-op)* and Toradol IV (intra-op)*   Induction: Intravenous  PONV Risk Score and Plan: 3 and Ondansetron, Dexamethasone, Midazolam, Propofol infusion and TIVA  Airway Management Planned: Oral ETT  Additional Equipment:   Intra-op Plan:   Post-operative Plan: Extubation in OR  Informed Consent: I have reviewed the patients History and Physical, chart, labs and discussed the procedure including the risks, benefits and alternatives for the proposed anesthesia with the patient or authorized representative who has indicated his/her understanding and acceptance.     Dental Advisory Given  Plan Discussed with: CRNA and Surgeon  Anesthesia Plan Comments:         Anesthesia Quick Evaluation

## 2024-01-22 ENCOUNTER — Other Ambulatory Visit: Payer: Self-pay

## 2024-01-22 ENCOUNTER — Encounter: Payer: Self-pay | Admitting: Neurosurgery

## 2024-01-22 ENCOUNTER — Encounter
Admission: RE | Disposition: A | Discharge status: Home / Self Care | Payer: Self-pay | Source: Home / Self Care | Attending: Neurosurgery

## 2024-01-22 ENCOUNTER — Ambulatory Visit

## 2024-01-22 ENCOUNTER — Ambulatory Visit: Payer: Self-pay | Admitting: Anesthesiology

## 2024-01-22 ENCOUNTER — Ambulatory Visit
Admission: RE | Admit: 2024-01-22 | Discharge: 2024-01-22 | Disposition: A | Discharge status: Home / Self Care | Attending: Neurosurgery | Admitting: Neurosurgery

## 2024-01-22 DIAGNOSIS — G959 Disease of spinal cord, unspecified: Secondary | ICD-10-CM | POA: Insufficient documentation

## 2024-01-22 DIAGNOSIS — Z6838 Body mass index (BMI) 38.0-38.9, adult: Secondary | ICD-10-CM | POA: Insufficient documentation

## 2024-01-22 DIAGNOSIS — Z01818 Encounter for other preprocedural examination: Secondary | ICD-10-CM

## 2024-01-22 DIAGNOSIS — E669 Obesity, unspecified: Secondary | ICD-10-CM | POA: Diagnosis not present

## 2024-01-22 DIAGNOSIS — G4733 Obstructive sleep apnea (adult) (pediatric): Secondary | ICD-10-CM | POA: Insufficient documentation

## 2024-01-22 DIAGNOSIS — E119 Type 2 diabetes mellitus without complications: Secondary | ICD-10-CM | POA: Insufficient documentation

## 2024-01-22 DIAGNOSIS — M4802 Spinal stenosis, cervical region: Secondary | ICD-10-CM | POA: Insufficient documentation

## 2024-01-22 DIAGNOSIS — I10 Essential (primary) hypertension: Secondary | ICD-10-CM | POA: Diagnosis not present

## 2024-01-22 DIAGNOSIS — G9589 Other specified diseases of spinal cord: Secondary | ICD-10-CM

## 2024-01-22 DIAGNOSIS — R7303 Prediabetes: Secondary | ICD-10-CM

## 2024-01-22 DIAGNOSIS — Z87891 Personal history of nicotine dependence: Secondary | ICD-10-CM | POA: Insufficient documentation

## 2024-01-22 LAB — GLUCOSE, CAPILLARY: Glucose-Capillary: 125 mg/dL — ABNORMAL HIGH (ref 70–99)

## 2024-01-22 LAB — ABO/RH: ABO/RH(D): A POS

## 2024-01-22 SURGERY — ANTERIOR CERVICAL DECOMPRESSION/DISCECTOMY FUSION 2 LEVELS
Anesthesia: General | Site: Spine Cervical

## 2024-01-22 MED ORDER — PHENYLEPHRINE HCL-NACL 20-0.9 MG/250ML-% IV SOLN
INTRAVENOUS | Status: AC
  Filled 2024-01-22: qty 250

## 2024-01-22 MED ORDER — MIDAZOLAM HCL 2 MG/2ML IJ SOLN
INTRAMUSCULAR | Status: DC | PRN
  Administered 2024-01-22: 2 mg via INTRAVENOUS

## 2024-01-22 MED ORDER — PROPOFOL 10 MG/ML IV BOLUS
INTRAVENOUS | Status: DC | PRN
  Administered 2024-01-22: 200 mg via INTRAVENOUS

## 2024-01-22 MED ORDER — REMIFENTANIL HCL 1 MG IV SOLR
INTRAVENOUS | Status: AC
  Filled 2024-01-22: qty 1000

## 2024-01-22 MED ORDER — PHENYLEPHRINE HCL-NACL 20-0.9 MG/250ML-% IV SOLN
INTRAVENOUS | Status: DC | PRN
  Administered 2024-01-22: 20 ug/min via INTRAVENOUS

## 2024-01-22 MED ORDER — PROPOFOL 500 MG/50ML IV EMUL
INTRAVENOUS | Status: DC | PRN
  Administered 2024-01-22: 100 ug/kg/min via INTRAVENOUS

## 2024-01-22 MED ORDER — SENNA 8.6 MG PO TABS
1.0000 | ORAL_TABLET | Freq: Two times a day (BID) | ORAL | 0 refills | Status: DC | PRN
  Filled 2024-01-22: qty 30, 15d supply, fill #0

## 2024-01-22 MED ORDER — FENTANYL CITRATE (PF) 100 MCG/2ML IJ SOLN
INTRAMUSCULAR | Status: AC
  Filled 2024-01-22: qty 2

## 2024-01-22 MED ORDER — MIDAZOLAM HCL 2 MG/2ML IJ SOLN
INTRAMUSCULAR | Status: AC
  Filled 2024-01-22: qty 2

## 2024-01-22 MED ORDER — SURGIFLO WITH THROMBIN (HEMOSTATIC MATRIX KIT) OPTIME
TOPICAL | Status: DC | PRN
  Administered 2024-01-22: 1 via TOPICAL

## 2024-01-22 MED ORDER — CEFAZOLIN SODIUM-DEXTROSE 2-4 GM/100ML-% IV SOLN
2.0000 g | Freq: Once | INTRAVENOUS | Status: AC
  Administered 2024-01-22: 2 g via INTRAVENOUS

## 2024-01-22 MED ORDER — LIDOCAINE HCL (PF) 2 % IJ SOLN
INTRAMUSCULAR | Status: AC
  Filled 2024-01-22: qty 5

## 2024-01-22 MED ORDER — LACTATED RINGERS IV SOLN: INTRAVENOUS | Status: DC

## 2024-01-22 MED ORDER — REMIFENTANIL HCL 1 MG IV SOLR
INTRAVENOUS | Status: DC | PRN
  Administered 2024-01-22: .1 ug/kg/min via INTRAVENOUS

## 2024-01-22 MED ORDER — OXYCODONE HCL 5 MG PO TABS
ORAL_TABLET | ORAL | Status: AC
  Filled 2024-01-22: qty 1

## 2024-01-22 MED ORDER — OXYCODONE HCL 5 MG/5ML PO SOLN: 5.0000 mg | Freq: Once | ORAL | Status: AC | PRN

## 2024-01-22 MED ORDER — OXYCODONE HCL 5 MG PO TABS
5.0000 mg | ORAL_TABLET | Freq: Once | ORAL | Status: AC | PRN
  Administered 2024-01-22: 5 mg via ORAL

## 2024-01-22 MED ORDER — BUPIVACAINE-EPINEPHRINE (PF) 0.5% -1:200000 IJ SOLN
INTRAMUSCULAR | Status: DC | PRN
Start: 2024-01-22 — End: 2024-01-22
  Administered 2024-01-22: 10 mL

## 2024-01-22 MED ORDER — ROCURONIUM BROMIDE 100 MG/10ML IV SOLN
INTRAVENOUS | Status: DC | PRN
  Administered 2024-01-22: 40 mg via INTRAVENOUS

## 2024-01-22 MED ORDER — PROPOFOL 10 MG/ML IV BOLUS
INTRAVENOUS | Status: AC
  Filled 2024-01-22: qty 20

## 2024-01-22 MED ORDER — ACETAMINOPHEN 500 MG PO TABS
ORAL_TABLET | ORAL | Status: AC
  Filled 2024-01-22: qty 2

## 2024-01-22 MED ORDER — ORAL CARE MOUTH RINSE: 15.0000 mL | Freq: Once | OROMUCOSAL | Status: AC

## 2024-01-22 MED ORDER — DEXAMETHASONE SODIUM PHOSPHATE 10 MG/ML IJ SOLN
INTRAMUSCULAR | Status: DC | PRN
  Administered 2024-01-22: 10 mg via INTRAVENOUS

## 2024-01-22 MED ORDER — FENTANYL CITRATE (PF) 100 MCG/2ML IJ SOLN
INTRAMUSCULAR | Status: DC | PRN
  Administered 2024-01-22: 100 ug via INTRAVENOUS

## 2024-01-22 MED ORDER — FENTANYL CITRATE (PF) 100 MCG/2ML IJ SOLN
25.0000 ug | INTRAMUSCULAR | Status: DC | PRN
  Administered 2024-01-22 (×4): 25 ug via INTRAVENOUS

## 2024-01-22 MED ORDER — PROPOFOL 1000 MG/100ML IV EMUL
INTRAVENOUS | Status: AC
  Filled 2024-01-22: qty 100

## 2024-01-22 MED ORDER — LIDOCAINE HCL (CARDIAC) PF 100 MG/5ML IV SOSY
PREFILLED_SYRINGE | INTRAVENOUS | Status: DC | PRN
  Administered 2024-01-22: 100 mg via INTRAVENOUS

## 2024-01-22 MED ORDER — ACETAMINOPHEN 10 MG/ML IV SOLN: 1000.0000 mg | Freq: Once | INTRAVENOUS | Status: DC | PRN

## 2024-01-22 MED ORDER — METHOCARBAMOL 500 MG PO TABS
500.0000 mg | ORAL_TABLET | Freq: Four times a day (QID) | ORAL | 0 refills | Status: DC
  Filled 2024-01-22: qty 120, 30d supply, fill #0

## 2024-01-22 MED ORDER — OXYCODONE HCL 5 MG PO TABS
5.0000 mg | ORAL_TABLET | ORAL | 0 refills | Status: DC | PRN
  Filled 2024-01-22: qty 30, 5d supply, fill #0

## 2024-01-22 MED ORDER — 0.9 % SODIUM CHLORIDE (POUR BTL) OPTIME
TOPICAL | Status: DC | PRN
  Administered 2024-01-22: 500 mL

## 2024-01-22 MED ORDER — SUCCINYLCHOLINE CHLORIDE 200 MG/10ML IV SOSY
PREFILLED_SYRINGE | INTRAVENOUS | Status: DC | PRN
  Administered 2024-01-22: 120 mg via INTRAVENOUS

## 2024-01-22 MED ORDER — ACETAMINOPHEN 500 MG PO TABS
1000.0000 mg | ORAL_TABLET | Freq: Once | ORAL | Status: AC
Start: 2024-01-22 — End: 2024-01-22
  Administered 2024-01-22: 1000 mg via ORAL

## 2024-01-22 MED ORDER — SUGAMMADEX SODIUM 200 MG/2ML IV SOLN
INTRAVENOUS | Status: DC | PRN
  Administered 2024-01-22: 200 mg via INTRAVENOUS

## 2024-01-22 MED ORDER — ONDANSETRON HCL 4 MG/2ML IJ SOLN
INTRAMUSCULAR | Status: AC
  Filled 2024-01-22: qty 2

## 2024-01-22 MED ORDER — CHLORHEXIDINE GLUCONATE 0.12 % MT SOLN
OROMUCOSAL | Status: AC
  Filled 2024-01-22: qty 15

## 2024-01-22 MED ORDER — DROPERIDOL 2.5 MG/ML IJ SOLN: 0.6250 mg | Freq: Once | INTRAMUSCULAR | Status: DC | PRN

## 2024-01-22 MED ORDER — CHLORHEXIDINE GLUCONATE 0.12 % MT SOLN
15.0000 mL | Freq: Once | OROMUCOSAL | Status: AC
  Administered 2024-01-22: 15 mL via OROMUCOSAL

## 2024-01-22 MED ORDER — ROCURONIUM BROMIDE 10 MG/ML (PF) SYRINGE
PREFILLED_SYRINGE | INTRAVENOUS | Status: AC
  Filled 2024-01-22: qty 10

## 2024-01-22 MED ORDER — BISOPROLOL FUMARATE 5 MG PO TABS
5.0000 mg | ORAL_TABLET | Freq: Once | ORAL | Status: AC
  Administered 2024-01-22: 5 mg via ORAL
  Filled 2024-01-22: qty 1

## 2024-01-22 MED ORDER — DEXAMETHASONE SODIUM PHOSPHATE 10 MG/ML IJ SOLN
INTRAMUSCULAR | Status: AC
  Filled 2024-01-22: qty 1

## 2024-01-22 MED ORDER — CEFAZOLIN SODIUM-DEXTROSE 2-4 GM/100ML-% IV SOLN
INTRAVENOUS | Status: AC
  Filled 2024-01-22: qty 100

## 2024-01-22 MED ORDER — ONDANSETRON HCL 4 MG/2ML IJ SOLN
INTRAMUSCULAR | Status: DC | PRN
  Administered 2024-01-22: 4 mg via INTRAVENOUS

## 2024-01-22 MED ORDER — METHOCARBAMOL 500 MG PO TABS
500.0000 mg | ORAL_TABLET | Freq: Four times a day (QID) | ORAL | Status: DC
  Administered 2024-01-22: 500 mg via ORAL

## 2024-01-22 MED ORDER — METHOCARBAMOL 500 MG PO TABS
ORAL_TABLET | ORAL | Status: AC
  Filled 2024-01-22: qty 1

## 2024-01-22 SURGICAL SUPPLY — 43 items
BASIN KIT SINGLE STR (MISCELLANEOUS) ×1 IMPLANT
BUR NEURO DRILL SOFT 3.0X3.8M (BURR) ×1 IMPLANT
DERMABOND ADVANCED .7 DNX12 (GAUZE/BANDAGES/DRESSINGS) ×1 IMPLANT
DRAIN CHANNEL JP 10F RND 20C F (MISCELLANEOUS) IMPLANT
DRAPE C ARM PK CFD 31 SPINE (DRAPES) ×1 IMPLANT
DRAPE C-ARM XRAY 36X54 (DRAPES) IMPLANT
DRAPE LAPAROTOMY 77X122 PED (DRAPES) ×1 IMPLANT
DRAPE MICROSCOPE SPINE 48X150 (DRAPES) ×1 IMPLANT
DRSG TEGADERM 4X4.75 (GAUZE/BANDAGES/DRESSINGS) IMPLANT
ELECTRODE REM PT RTRN 9FT ADLT (ELECTROSURGICAL) ×1 IMPLANT
EVACUATOR 1/8 PVC DRAIN (DRAIN) IMPLANT
EVACUATOR SILICONE 100CC (DRAIN) IMPLANT
FEE INTRAOP CADWELL SUPPLY NCS (MISCELLANEOUS) IMPLANT
FEE INTRAOP MONITOR IMPULS NCS (MISCELLANEOUS) IMPLANT
GAUZE SPONGE 2X2 STRL 8-PLY (GAUZE/BANDAGES/DRESSINGS) IMPLANT
GLOVE BIOGEL PI IND STRL 6.5 (GLOVE) ×1 IMPLANT
GLOVE SURG SYN 6.5 ES PF (GLOVE) ×1 IMPLANT
GLOVE SURG SYN 6.5 PF PI (GLOVE) ×1 IMPLANT
GLOVE SURG SYN 8.5 E (GLOVE) ×3 IMPLANT
GLOVE SURG SYN 8.5 PF PI (GLOVE) ×3 IMPLANT
GOWN SRG LRG LVL 4 IMPRV REINF (GOWNS) ×1 IMPLANT
GOWN SRG XL LVL 3 NONREINFORCE (GOWNS) ×1 IMPLANT
KIT TURNOVER KIT A (KITS) ×1 IMPLANT
MANIFOLD NEPTUNE II (INSTRUMENTS) ×1 IMPLANT
NDL SAFETY ECLIPSE 18X1.5 (NEEDLE) ×1 IMPLANT
NS IRRIG 500ML POUR BTL (IV SOLUTION) ×1 IMPLANT
PACK LAMINECTOMY ARMC (PACKS) ×1 IMPLANT
PAD ARMBOARD POSITIONER FOAM (MISCELLANEOUS) ×2 IMPLANT
PIN CASPAR 14 (PIN) ×1 IMPLANT
PIN CASPAR 14MM (PIN) ×1 IMPLANT
PLATE ACP 1.6X36 2LVL (Plate) IMPLANT
SCREW ACP 3.5X17 S/D VARIA (Screw) IMPLANT
SPACER CERVICAL FRGE 12X14X7-7 (Spacer) IMPLANT
SPONGE KITTNER 5P (MISCELLANEOUS) ×1 IMPLANT
STAPLER SKIN PROX 35W (STAPLE) IMPLANT
SURGIFLO W/THROMBIN 8M KIT (HEMOSTASIS) ×1 IMPLANT
SUT STRATA 3-0 15 PS-2 (SUTURE) ×1 IMPLANT
SUT VIC AB 3-0 SH 8-18 (SUTURE) ×1 IMPLANT
SUTURE EHLN 3-0 FS-10 30 BLK (SUTURE) IMPLANT
SYR 20ML LL LF (SYRINGE) ×1 IMPLANT
TAPE CLOTH 3X10 WHT NS LF (GAUZE/BANDAGES/DRESSINGS) ×2 IMPLANT
TRAP FLUID SMOKE EVACUATOR (MISCELLANEOUS) ×1 IMPLANT
WATER STERILE IRR 500ML POUR (IV SOLUTION) IMPLANT

## 2024-01-22 NOTE — Discharge Summary (Signed)
 Discharge Summary  Patient ID: Stephanie Francis MRN: [id number] DOB/AGE: February 25, 1966 58 y.o.  Admit date: 01/22/2024 Discharge date: 01/22/2024  Admission Diagnoses: G95.9 Cervical myelopathy, M48.02 Cervical stenosis of spinal canal, G95.89 Myelomalacia   Discharge Diagnoses:  Active Problems:   * No active hospital problems. *   Discharged Condition: good  Hospital Course:  Stephanie Francis is a 58 y.o presenting with cervical stenosis s/p C4-6 ACDF. Her intraoperative course was uncomplicated. She ws monitored in PACU for a minimum of 4 hours.  She was discharged home after ambulating, urinating, and tolerating p.o. intake.  She was given prescriptions for oxycodone, Robaxin, and senna to take as needed.  Consults: None  Significant Diagnostic Studies: NA  Treatments: surgery: as above. Please see separately dictated operative report for further details  Discharge Exam: Blood pressure 131/73, pulse 73, temperature (!) 97.1 F (36.2 C), temperature source Temporal, resp. rate 18, height 5\' 6"  (1.676 m), weight 109.3 kg, SpO2 97%. CN grossly intact MAEW Incision c/d/i  Disposition: Discharge disposition: 01-Home or Self Care        Allergies as of 01/22/2024   No Known Allergies      Medication List     TAKE these medications    bisoprolol-hydrochlorothiazide 5-6.25 MG tablet Commonly known as: ZIAC Take 1 tablet by mouth every morning.   cetirizine 10 MG tablet Commonly known as: ZYRTEC Take 10 mg by mouth as needed.   fluticasone 50 MCG/ACT nasal spray Commonly known as: FLONASE Place 2 sprays into both nostrils daily as needed for allergies or rhinitis.   levETIRAcetam  750 MG tablet Commonly known as: KEPPRA  Take 750 mg by mouth 2 (two) times daily.   MAGNESIUM PO Take 1 tablet by mouth daily.   methocarbamol 500 MG tablet Commonly known as: ROBAXIN Take 1 tablet (500 mg total) by mouth 4 (four) times daily.   multivitamin with minerals Tabs  tablet Take 1 tablet by mouth daily.   oxyCODONE 5 MG immediate release tablet Commonly known as: Roxicodone Take 1 tablet (5 mg total) by mouth every 4 (four) hours as needed for severe pain (pain score 7-10).   senna 8.6 MG Tabs tablet Commonly known as: SENOKOT Take 1 tablet (8.6 mg total) by mouth 2 (two) times daily as needed.   traZODone 50 MG tablet Commonly known as: DESYREL Take 50 mg by mouth at bedtime.   VITAMIN D3 PO Take 1 tablet by mouth daily.         Signed: Noble Bateman 01/22/2024, 9:35 AM

## 2024-01-22 NOTE — Discharge Instructions (Signed)
 Your surgeon has performed an operation on your cervical spine (neck) to relieve pressure on the spinal cord and/or nerves. This involved making an incision in the front of your neck and removing one or more of the discs that support your spine. Next, a small piece of bone, a titanium plate, and screws were used to fuse two or more of the vertebrae (bones) together.  The following are instructions to help in your recovery once you have been discharged from the hospital. Even if you feel well, it is important that you follow these activity guidelines. If you do not let your neck heal properly from the surgery, you can increase the chance of return of your symptoms and other complications.  * Do not take anti-inflammatory medications for 3 months after surgery (naproxen [Aleve], ibuprofen [Advil, Motrin], etc.). These medications can prevent your bones from healing properly.  Celebrex, if prescribed, is ok to take.  *Regarding compression stockings-  Please wear day and night until you are walking a couple hundred feet three times a day.   Activity    No bending, lifting, or twisting ("BLT"). Avoid lifting objects heavier than 10 pounds (gallon milk jug).  Where possible, avoid household activities that involve lifting, bending, reaching, pushing, or pulling such as laundry, vacuuming, grocery shopping, and childcare. Try to arrange for help from friends and family for these activities while your back heals.  Increase physical activity slowly as tolerated.  Taking short walks is encouraged, but avoid strenuous exercise. Do not jog, run, bicycle, lift weights, or participate in any other exercises unless specifically allowed by your doctor.  Talk to your doctor before resuming sexual activity.  You should not drive until cleared by your doctor.  Until released by your doctor, you should not return to work or school.  You should rest at home and let your body heal.   You may shower three days  after your surgery.  After showering, lightly dab your incision dry. Do not take a tub bath or go swimming until approved by your doctor at your follow-up appointment.  If your doctor ordered a cervical collar (neck brace) for you, you should wear it whenever you are out of bed. You may remove it when lying down or sleeping, but you should wear it at all other times. Not all neck surgeries require a cervical collar.  If you smoke, we strongly recommend that you quit.  Smoking has been proven to interfere with normal bone healing and will dramatically reduce the success rate of your surgery. Please contact [facility] (800-QUIT-NOW) and use the resources at www.[facility].com for assistance in stopping smoking.  Surgical Incision   If you have a dressing on your incision, you may remove it two days after your surgery. Keep your incision area clean and dry.  If you have staples or stitches on your incision, you should have a follow up scheduled for removal. If you do not have staples or stitches, you will have steri-strips (small pieces of surgical tape) or Dermabond glue. The steri-strips/glue should begin to peel away within about a week (it is fine if the steri-strips fall off before then). If the strips are still in place one week after your surgery, you may gently remove them.  Diet           You may return to your usual diet. However, you may experience discomfort when swallowing in the first month after your surgery. This is normal. You may find that softer foods are  more comfortable for you to swallow. Be sure to stay hydrated.  When to Contact Us   You may experience pain in your neck and/or pain between your shoulder blades. This is normal and should improve in the next few weeks with the help of pain medication, muscle relaxers, and rest. Some patients report that a warm compress on the back of the neck or between the shoulder blades helps.  However, should you experience any of the  following, contact us  immediately: New numbness or weakness Pain that is progressively getting worse, and is not relieved by your pain medication, muscle relaxers, rest, and warm compresses Bleeding, redness, swelling, pain, or drainage from surgical incision Chills or flu-like symptoms Fever greater than 101.0 F (38.3 C) Inability to eat, drink fluids, or take medications Problems with bowel or bladder functions Difficulty breathing or shortness of breath Warmth, tenderness, or swelling in your calf Contact Information How to contact us :  If you have any questions/concerns before or after surgery, you can reach us  at [phone], or you can send a mychart message. We can be reached by phone or mychart 8am-4pm, Monday-Friday.  *Please note: Calls after 4pm are forwarded to a third party answering service. Mychart messages are not routinely monitored during evenings, weekends, and holidays. Please call our office to contact the answering service for urgent concerns during non-business hours.

## 2024-01-22 NOTE — H&P (Signed)
 Referring Physician:  No referring provider defined for this encounter.  Primary Physician:  Delmus Ferri, MD  History of Present Illness: 01/22/2024 Stephanie Francis presents today for surgical intervention.  12/12/2023 Stephanie Francis is here today with a chief complaint of numbness in her hands, specifically the first two fingers on the right hand and the last three fingers on the left hand. The numbness has been present for a significant period, with the patient noting that the numbness in the first two fingers has been present for approximately ten years. The patient denies any weakness or significant difficulty with her hands, such as opening jars, but does report minor issues with dexterity. These issues include difficulty with buttons and small items, such as earring posts, and a feeling of awkwardness when trying to tie something. The patient denies any other symptoms, including balance issues, tripping, falling, or changes in handwriting.  She has had symptoms for 2 years.  Her balance issues have slowly worsened.  Her numbness is also slowly worsened.  Bowel/Bladder Dysfunction: none  Conservative measures: seen a chiropractor around 10 years ago Physical therapy:  has not participated in PT; participated in OT without relief Multimodal medical therapy including regular antiinflammatories:  none Injections:  no epidural steroid injections  Past Surgery: 02/26/22: Meningioma Resection by Dr. Rosea Conch has symptoms of cervical myelopathy.  The symptoms are causing a significant impact on the patient's life.   I have utilized the care everywhere function in epic to review the outside records available from external health systems.  Review of Systems:  A 10 point review of systems is negative, except for the pertinent positives and negatives detailed in the HPI.  Past Medical History: Past Medical History:  Diagnosis Date   Amenorrhea    Anxiety     Central retinal vein occlusion of right eye    Cervical radiculopathy    Depression    Diet-controlled type 2 diabetes mellitus (HCC)    Essential hypertension    Former smoker    GERD (gastroesophageal reflux disease)    Meningioma (HCC)    a.) 5 cm RIGHT frontal; s/p resection 02/26/2022; per neurology "remains at risk for seizure despite resection of her frontal meningioma given her resection cavity, which still acts as a nidus for seizures"   Obesity    OSA (obstructive sleep apnea)    a.) unable to tolerate nocturnal PAP therapy   Seizures (HCC)    last seizure on 01-14-24    Past Surgical History: Past Surgical History:  Procedure Laterality Date   Angiolipoma removal     CHOLECYSTECTOMY     CRANIOTOMY W/EXCISION SUPRATENTORIAL MENINGIOMA  02/26/2022   TONSILLECTOMY      Allergies: Allergies as of 12/19/2023   (No Known Allergies)    Medications:  Current Facility-Administered Medications:    acetaminophen  (TYLENOL ) tablet 1,000 mg, 1,000 mg, Oral, Once, Stephanie Francis, Stephanie Pai, MD   ceFAZolin (ANCEF) IVPB 2g/100 mL premix, 2 g, Intravenous, Once, Stephanie Munch, MD   lactated ringers infusion, , Intravenous, Continuous, Stephanie Hall Alston Jerry, MD  Social History: Social History   Tobacco Use   Smoking status: Former    Current packs/day: 0.00    Types: Cigarettes    Quit date: 05/03/1990    Years since quitting: 33.7   Smokeless tobacco: Never  Vaping Use   Vaping status: Never Used  Substance Use Topics   Alcohol use: Yes    Alcohol/week: 8.0 standard drinks of  alcohol    Types: 8 Cans of beer per week    Comment: occ wine   Drug use: Not Currently    Types: Marijuana    Comment: years ago    Family Medical History: Family History  Problem Relation Age of Onset   Breast cancer Neg Hx     Physical Examination: Vitals:   01/22/24 0614  BP: 131/73  Pulse: 73  Resp: 18  Temp: (!) 97.1 F (36.2 C)  SpO2: 97%   Heart sounds normal no MRG. Chest  Clear to Auscultation Bilaterally.  General: Patient is in no apparent distress. Attention to examination is appropriate.  Neck:   Supple.  Full range of motion.  Respiratory: Patient is breathing without any difficulty.   NEUROLOGICAL:     Awake, alert, oriented to person, place, and time.  Speech is clear and fluent.   Cranial Nerves: Pupils equal round and reactive to light.  Facial tone is symmetric.  Facial sensation is symmetric. Shoulder shrug is symmetric. Tongue protrusion is midline.  There is no pronator drift.  Strength: Side Biceps Triceps Deltoid Interossei Grip Wrist Ext. Wrist Flex.  R 5 5 5 5  4+ 5 5  L 5 5 5 5  4+ 5 5   Side Iliopsoas Quads Hamstring PF DF EHL  R 5 5 5 5 5 5   L 5 5 5 5 5 5    Reflexes are 3+ and symmetric at the biceps, triceps, brachioradialis, patella and achilles.   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   Bilateral upper and lower extremity sensation is intact to light touch.    No evidence of dysmetria noted.  Gait is untested.   Medical Decision Making  Imaging: MRI C spine 12/09/2023 Narrative & Impression  CLINICAL DATA:  Left hand numbness   EXAM: MRI CERVICAL SPINE WITHOUT CONTRAST   TECHNIQUE: Multiplanar, multisequence MR imaging of the cervical spine was performed. No intravenous contrast was administered.   COMPARISON:  MRI 12/25/2022   FINDINGS: Alignment: Straightening of the cervical lordosis. No significant listhesis.   Vertebrae: No fracture, evidence of discitis, or bone lesion.   Cord: Redemonstration of two areas of cord signal abnormality centered at the C4-5 and C5-6 intervertebral disc levels with areas of bilateral T2 hyperintense signal in the cord ("owl-eye" pattern). Cord is mildly atrophic at both segments. No new sites of cord signal abnormality.   Posterior Fossa, vertebral arteries, paraspinal tissues: Negative.   Disc levels:   C2-C3: Minimal disc bulge with bilateral facet arthropathy. No  canal stenosis. Mild left foraminal stenosis. Unchanged.   C3-C4: Disc osteophyte complex with bilateral facet and uncovertebral arthropathy. Mild-moderate canal stenosis with moderate bilateral foraminal stenosis. No significant interval progression.   C4-C5: Disc osteophyte complex with bilateral facet and uncovertebral arthropathy. Moderate canal stenosis with moderate right and mild left foraminal stenosis. No significant interval progression.   C5-C6: Disc osteophyte complex with bilateral facet and uncovertebral arthropathy. Moderate canal stenosis with moderate-severe bilateral foraminal stenosis. No significant interval progression.   C6-C7: Disc osteophyte complex with bilateral facet and uncovertebral arthropathy. No significant canal stenosis. Mild-moderate left foraminal stenosis. No significant interval progression.   C7-T1: Mild disc bulge with facet and uncovertebral spurring. No significant foraminal or canal stenosis. Unchanged.   IMPRESSION: 1. Redemonstration of two areas of cord signal abnormality centered at the C4-5 and C5-6 intervertebral disc levels with areas of bilateral T2 hyperintense signal in the cord ("owl-eye" pattern). Cord is mildly atrophic at both segments. This is  unchanged in appearance compared to the prior MRI and may be secondary to chronic cord ischemia or chronic compressive myelomalacia. 2. Multilevel cervical spondylosis, not significantly progressed from prior. 3. Moderate canal stenosis at C4-5 and C5-6. Mild-to-moderate canal stenosis at C3-4. 4. Multilevel foraminal stenosis, moderate-severe bilaterally at C5-6.     Electronically Signed   By: Leverne Reading D.O.   On: 12/09/2023 19:53    I have personally reviewed the images and agree with the above interpretation.  Assessment and Plan: Ms. Mazmanian is a pleasant 58 y.o. 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  We will proceed with ACDF C4-6.    Tyrick Dunagan K. Mont Antis MD, [facility]

## 2024-01-22 NOTE — Transfer of Care (Signed)
 Immediate Anesthesia Transfer of Care Note  Patient: Stephanie Francis  Procedure(s) Performed: ANTERIOR CERVICAL DECOMPRESSION/DISCECTOMY FUSION 2 LEVELS (Spine Cervical)  Patient Location: PACU  Anesthesia Type:General  Level of Consciousness: awake  Airway & Oxygen Therapy: Patient Spontanous Breathing and Patient connected to face mask oxygen  Post-op Assessment: Report given to RN and Post -op Vital signs reviewed and stable  Post vital signs: Reviewed and stable  Last Vitals:  Vitals Value Taken Time  BP 161/90 01/22/24 0945  Temp    Pulse 79 01/22/24 0945  Resp 12 01/22/24 0945  SpO2 96 % 01/22/24 0945  Vitals shown include unfiled device data.  Last Pain:  Vitals:   01/22/24 0614  TempSrc: Temporal  PainSc: 2          Complications: No notable events documented.

## 2024-01-22 NOTE — Progress Notes (Signed)
 Patient urinated on the bed and is also able to intake fluids with no issues of swallowing. Patient educated that she would need to be in the unit for an additional 3 hours ( she was in PACU for 1 hour) and she would need to ambulate prior to discharge. She and her son verbalized understanding.

## 2024-01-22 NOTE — Op Note (Signed)
 Indications: Ms. Stephanie Francis is a 58 y.o. female with G95.9 Cervical myelopathy, M48.02 Cervical stenosis of spinal canal, G95.89 Myelomalacia   Due to ongoing symptoms, surgical was recommended.  Findings: stenosis, successful decompression  Preoperative Diagnosis: G95.9 Cervical myelopathy, M48.02 Cervical stenosis of spinal canal, G95.89 Myelomalacia  Postoperative Diagnosis: same   EBL: 10 ml IVF: see AR Drains: none Disposition: Extubated and Stable to PACU Complications: none  No foley catheter was placed.   Preoperative Note:    Risks of surgery discussed include: infection, bleeding, stroke, coma, death, paralysis, CSF leak, nerve/spinal cord injury, numbness, tingling, weakness, complex regional pain syndrome, recurrent stenosis and/or disc herniation, vascular injury, development of instability, neck/back pain, need for further surgery, persistent symptoms, development of deformity, and the risks of anesthesia. The patient understood these risks and agreed to proceed.   Procedure:  1) Anterior cervical diskectomy and fusion at C4/5 and C5/6 2) Anterior cervical instrumentation at C4 - 6 using Nuvasive ACP 3) Placement of structural allograft ([place]) 4) Use of operative microscope 5) Use of flouroscopy   Procedure: After obtaining informed consent, the patient taken to the operating room, placed in supine position, general anesthesia induced.  The patient had a small shoulder roll placed behind their shoulders.  The patient received preop antibiotics and IV Decadron.  A timeout was performed. The patient had a neck incision outlined, was prepped and draped in usual sterile fashion. The incision was injected with local anesthetic.   An incision was opened, dissection taken down medial to the carotid artery and jugular vein, lateral to the trachea and esophagus.  The prevertebral fascia identified and a localizing x-ray demonstrated the correct level.  The longus colli  were dissected laterally, and self-retaining retractors placed to open the operative field. The microscope was then brought into the field.  With this complete, distractor pins were placed in the vertebral bodies of C4 and C6. The distractor was placed, and the anuli at C4/5 and C5/6 were opened using a bovie.  Curettes and pituitary rongeurs used to remove the majority of disk, then the drill was used to remove the posterior osteophyte and begin the foraminotomies. The nerve hook was used to elevate the posterior longitudinal ligament, which was then removed with Kerrison rongeurs. The microblunt nerve hook could be passed out the foramen bilaterally at each level.   Meticulous hemostasis was obtained.  Structural allograft was tapped behind the anterior lip of the vertebral body at C4/5 (7 mm) and C5/6 (7 mm).    Please note that the procedure included removal of the disc, removal of the posterior osteophytes, and removal of the posterior longitudinal ligament to ensure decompression of the spinal cord.  Additionally, foraminotomies were performed on both sides of the spinal canal to decompress the nerve roots. This was performed at each level.  The caspar distractor was removed, and bone wax used for hemostasis. A separate, 36 mm 3 segment Nuvasive ACP plate was chosen.  Two screws placed in each vertebral body, respectively making sure the screws were behind the locking mechanism.  Final AP and lateral radiographs were taken.   Please note that the plate is not inclusive to the interbody structural allograft.  The anchoring mechanism of the plate is completely separate from the allograft.  With everything in good position, the wound was irrigated copiously and meticulous hemostasis obtained.  Wound was closed in 2 layers using interrupted inverted 3-0 Vicryl sutures.  The wound was dressed with dermabond, the head of  bed at 30 degrees, taken to recovery room in stable condition.  No new postop  neurological deficits were identified.  Sponge and pattie counts were correct at the end of the procedure.   Monitoring was stable throughout.   I performed the entire procedure with the assistance of Anastacio Karvonen PA as an Designer, television/film set. An assistant was required for this procedure due to the complexity.  The assistant provided assistance in tissue manipulation and suction, and was required for the successful and safe performance of the procedure. I performed the critical portions of the procedure.   Jodeen Munch MD

## 2024-01-22 NOTE — Anesthesia Procedure Notes (Signed)
 Procedure Name: Intubation Date/Time: 01/22/2024 7:28 AM  Performed by: Juanda Noon, CRNAPre-anesthesia Checklist: Patient identified, Patient being monitored, Timeout performed, Emergency Drugs available and Suction available Patient Re-evaluated:Patient Re-evaluated prior to induction Oxygen Delivery Method: Circle system utilized Preoxygenation: Pre-oxygenation with 100% oxygen Induction Type: IV induction Ventilation: Mask ventilation without difficulty Laryngoscope Size: Mac, 3 and McGrath Grade View: Grade I Tube type: Oral Tube size: 7.0 mm Number of attempts: 1 Airway Equipment and Method: Stylet Placement Confirmation: ETT inserted through vocal cords under direct vision, positive ETCO2 and breath sounds checked- equal and bilateral Secured at: 23 cm Tube secured with: Tape Dental Injury: Teeth and Oropharynx as per pre-operative assessment

## 2024-01-22 NOTE — Anesthesia Postprocedure Evaluation (Signed)
 Anesthesia Post Note  Patient: HARUKA LEMERT  Procedure(s) Performed: ANTERIOR CERVICAL DECOMPRESSION/DISCECTOMY FUSION 2 LEVELS (Spine Cervical)  Patient location during evaluation: PACU Anesthesia Type: General Level of consciousness: awake and alert Pain management: pain level controlled Vital Signs Assessment: post-procedure vital signs reviewed and stable Respiratory status: spontaneous breathing, nonlabored ventilation and respiratory function stable Cardiovascular status: blood pressure returned to baseline and stable Postop Assessment: no apparent nausea or vomiting Anesthetic complications: no   No notable events documented.   Last Vitals:  Vitals:   01/22/24 1105 01/22/24 1415  BP: (!) 154/71 (!) 145/69  Pulse: 64 62  Resp: 16 18  Temp: (!) 36.3 C (!) 36.3 C  SpO2: 98% 98%    Last Pain:  Vitals:   01/22/24 1415  TempSrc: Oral  PainSc: 2                  Baltazar Bonier

## 2024-01-22 NOTE — Progress Notes (Signed)
 Patient was able to ambulate 40 feet to the bathroom with minimal assistance. She is now eating 4ounces of Lite Applesauce with no difficulty.

## 2024-01-23 ENCOUNTER — Encounter: Payer: Self-pay | Admitting: Neurosurgery

## 2024-01-31 NOTE — Progress Notes (Unsigned)
   REFERRING PHYSICIAN:  Mclaughlin, Miriam K, Md [address] [facility] [place],  [place] [address]  DOS: 01/22/24 ACDF C4-C6 for myelopathy  HISTORY OF PRESENT ILLNESS: Stephanie Francis is 2 weeks status post above surgery. Given oxycodone  and robaxin  on discharge from the hospital.   She has no neck or arm pain. She still has her preop bilateral hand numbness. It has not improved yet. No significant swallowing issues.   She is not taking robaxin  or oxycodone .    PHYSICAL EXAMINATION:  NEUROLOGICAL:  General: In no acute distress.   Awake, alert, oriented to person, place, and time.  Pupils equal round and reactive to light.  Facial tone is symmetric.    Strength: Side Biceps Triceps Deltoid Interossei Grip Wrist Ext. Wrist Flex.  R 5 5 5 5 5 5 5   L 5 5 5 5 5 5 5    Incision c/d/i  Imaging:  Nothing new to review.   Assessment / Plan: Stephanie Francis is doing well s/p above surgery. Treatment options reviewed with patient and following plan made:   - We discussed activity escalation and I have advised the patient to lift up to 10 pounds until 6 weeks after surgery (until follow up with Dr. Mont Antis).   - Reviewed wound care.  - No visit to hairdresser for perm until after her 6 week postop visit.  - She had seizure a week prior to surgery- she cannot drive until October per neurology.  - Follow up as scheduled in 4 weeks and prn. Will need xrays prior to this visit.   Advised to contact the office if any questions or concerns arise.   Lucetta Russel PA-C Dept of Neurosurgery

## 2024-02-03 ENCOUNTER — Telehealth: Payer: Self-pay

## 2024-02-03 NOTE — Telephone Encounter (Signed)
 Ms Gunderson called to discuss some post-op questions.   - She questioned why we do not get x-rays at this appointment. I explained the reason for this and informed her that we typically avoid xrays this early unless she had a fall/injury to her neck since surgery.  - She inquired about getting massages at 3 months post op, which I informed her should be fine.  - She asked about driving. I explained that from a neurosurgical standpoint, our typical rule is that she cannot drive while taking narcotic pain medication and she needs to be able to see her mirrors. However, she recently had a seizure, so she will need to discuss driving with her neurologist.  - She asked about sleeping positions (she typically sleeps on her stomach). I explained there are no restrictions regarding sleeping restrictions. She recently bought a recliner to help with her recovery.  - She also asked about getting her hair dyed and a perm and cut on 5/21. I explained that it is too soon to do this only 3 weeks after a cervical fusion, but that she can discuss with Stacy at her appointment this week about what would be a more appropriate time frame for this.  I encouraged her to contact us  with any further questions/concerns. She will follow up Thursday as scheduled. She was appreciative of this.

## 2024-02-06 ENCOUNTER — Encounter: Payer: Self-pay | Admitting: Orthopedic Surgery

## 2024-02-06 ENCOUNTER — Ambulatory Visit (INDEPENDENT_AMBULATORY_CARE_PROVIDER_SITE_OTHER): Admitting: Orthopedic Surgery

## 2024-02-06 VITALS — BP 118/80 | Temp 97.9°F | Ht 65.0 in | Wt 243.8 lb

## 2024-02-06 DIAGNOSIS — G959 Disease of spinal cord, unspecified: Secondary | ICD-10-CM

## 2024-02-06 DIAGNOSIS — Z981 Arthrodesis status: Secondary | ICD-10-CM

## 2024-02-17 DIAGNOSIS — Z981 Arthrodesis status: Secondary | ICD-10-CM

## 2024-02-17 DIAGNOSIS — G959 Disease of spinal cord, unspecified: Secondary | ICD-10-CM

## 2024-02-18 MED ORDER — METHYLPREDNISOLONE 4 MG PO TBPK: ORAL_TABLET | ORAL | 0 refills | Status: DC

## 2024-02-18 NOTE — Addendum Note (Signed)
 Addended byLucetta Russel on: 02/18/2024 02:47 PM   Modules accepted: Orders

## 2024-02-18 NOTE — Telephone Encounter (Signed)
 DOS: 01/22/24 ACDF C4-C6 for myelopathy   Medrol  dose pack sent to pharmacy. Patient sent message.

## 2024-02-28 ENCOUNTER — Other Ambulatory Visit: Payer: Self-pay

## 2024-02-28 DIAGNOSIS — G959 Disease of spinal cord, unspecified: Secondary | ICD-10-CM

## 2024-03-03 ENCOUNTER — Ambulatory Visit
Admission: RE | Admit: 2024-03-03 | Discharge: 2024-03-03 | Disposition: A | Discharge status: Home / Self Care | Attending: Neurosurgery | Admitting: Neurosurgery

## 2024-03-03 ENCOUNTER — Ambulatory Visit
Admission: RE | Admit: 2024-03-03 | Discharge: 2024-03-03 | Disposition: A | Discharge status: Home / Self Care | Source: Ambulatory Visit | Attending: Neurosurgery | Admitting: Neurosurgery

## 2024-03-03 ENCOUNTER — Ambulatory Visit: Admitting: Neurosurgery

## 2024-03-03 VITALS — BP 122/72 | Temp 98.9°F | Ht 65.0 in | Wt 243.0 lb

## 2024-03-03 DIAGNOSIS — Z981 Arthrodesis status: Secondary | ICD-10-CM

## 2024-03-03 DIAGNOSIS — G959 Disease of spinal cord, unspecified: Secondary | ICD-10-CM

## 2024-03-03 NOTE — Progress Notes (Signed)
   REFERRING PHYSICIAN:  Mclaughlin, Miriam K, Pa [address] [facility] [place],  [place] [address]  DOS: 01/22/24 ACDF C4-C6 for myelopathy  HISTORY OF PRESENT ILLNESS: Stephanie Francis is status post above surgery.   She still having some symptoms of her myelopathy, but overall doing well.  She has some tightness in her neck.  She had a fall last weekend.    PHYSICAL EXAMINATION:  NEUROLOGICAL:  General: In no acute distress.   Awake, alert, oriented to person, place, and time.  Pupils equal round and reactive to light.  Facial tone is symmetric.    Strength: Side Biceps Triceps Deltoid Interossei Grip Wrist Ext. Wrist Flex.  R 5 5 5 5 5 5 5   L 5 5 5 5 5 5 5    Incision c/d/i  Imaging:  No complications noted   Assessment / Plan: Stephanie Francis is doing well s/p above surgery.   I have given her exercises for her neck.  We discussed activity escalation and return to exercise.  I have released her to start half day working on June 23.  She will move to 8-hour days on July 7.  We will see her back in clinic in 6 weeks.     Jodeen Munch MD Dept of Neurosurgery

## 2024-03-06 ENCOUNTER — Ambulatory Visit
Admission: RE | Admit: 2024-03-06 | Discharge: 2024-03-06 | Disposition: A | Discharge status: Home / Self Care | Source: Ambulatory Visit | Attending: Physician Assistant | Admitting: Physician Assistant

## 2024-03-06 DIAGNOSIS — Z1231 Encounter for screening mammogram for malignant neoplasm of breast: Secondary | ICD-10-CM | POA: Diagnosis present

## 2024-03-19 ENCOUNTER — Ambulatory Visit: Admitting: Neurosurgery

## 2024-04-07 IMAGING — MR MR HEAD W/O CM
11 series · 48 of 48 positions shown · non-contrast
Comparison: Head CT 02/09/2022.

CLINICAL DATA: Provided history: Meningioma.

EXAM:
MRI HEAD WITHOUT CONTRAST
TECHNIQUE: Multiplanar, multiecho pulse sequences of the brain and surrounding
structures were obtained without intravenous contrast.

[Series 5: ax dwi_tracew · axial · 3.0mm · 0.71mm/px · z∈[-75,+86]mm · 6 of 56 slices shown]
[im 1/56]
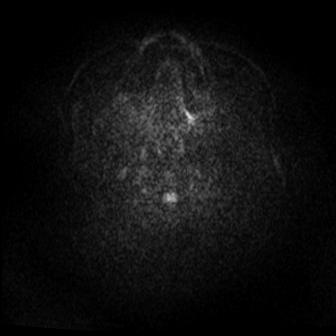
[im 12/56]
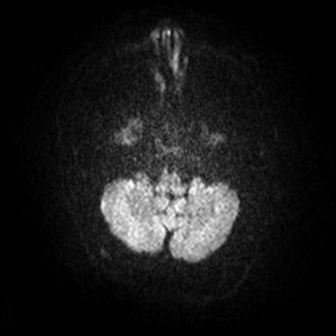
[im 23/56]
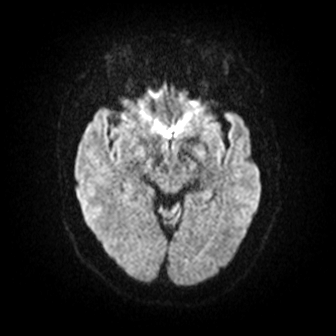
[im 34/56]
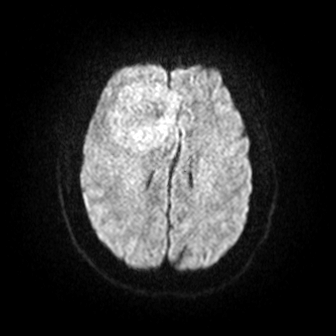
[im 45/56]
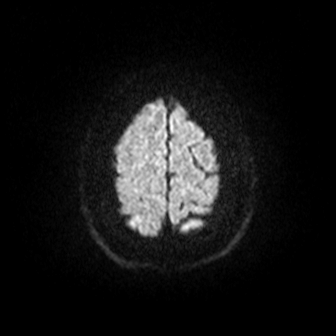
[im 56/56]
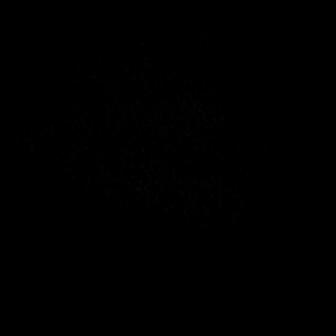

[Series 6: ax dwi_adc · axial · 3.0mm · 0.71mm/px · z∈[-75,+84]mm · 4 of 55 slices shown]
[im 1/55]
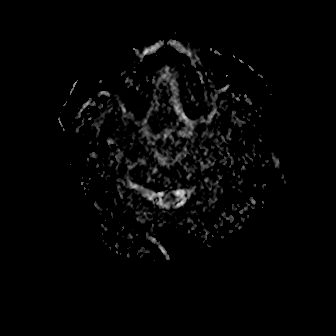
[im 19/55]
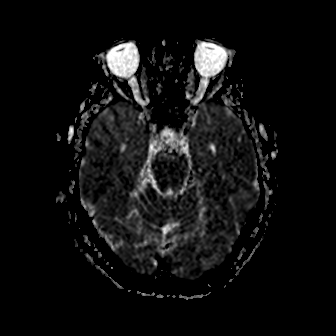
[im 37/55]
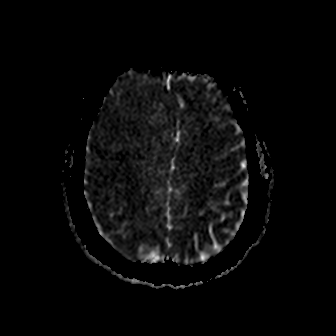
[im 55/55]
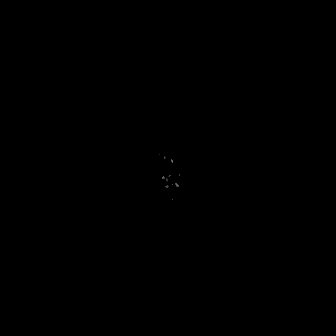

[Series 7: cor dwi_tracew · coronal · 5.0mm · 0.68mm/px · 3 of 38 slices shown]
[im 1/38]
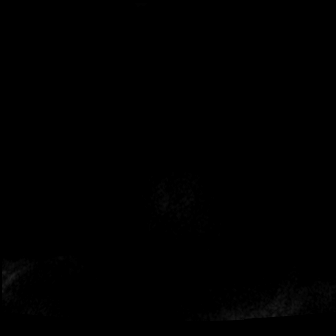
[im 19/38]
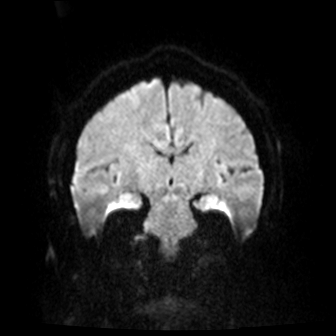
[im 38/38]
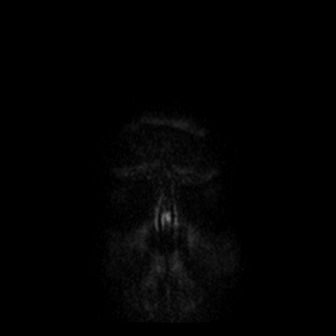

[Series 8: cor dwi_adc · coronal · 5.0mm · 0.68mm/px · 3 of 38 slices shown]
[im 1/38]
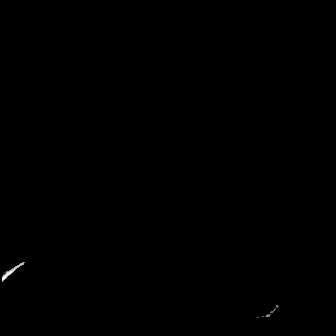
[im 19/38]
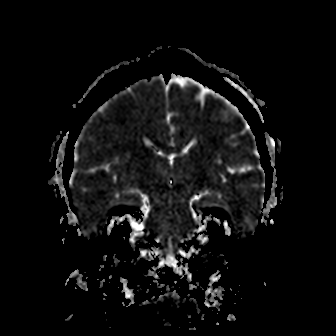
[im 38/38]
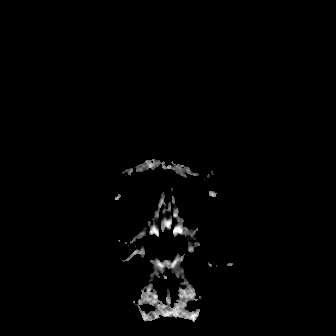

[Series 9: T1 · sagittal · 5.0mm · 0.47mm/px · 2 of 22 slices shown (1 of 2)]
[im 1/22]
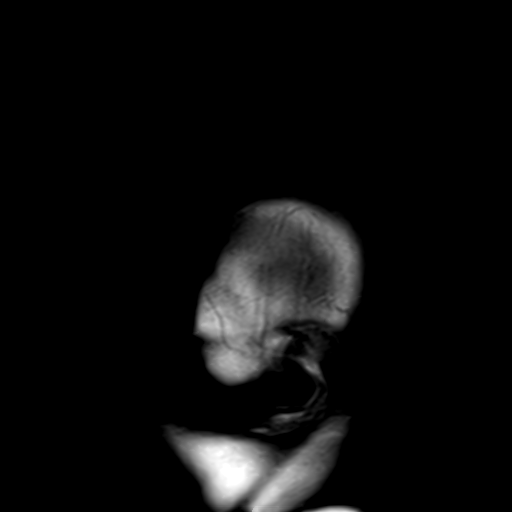
[im 22/22]
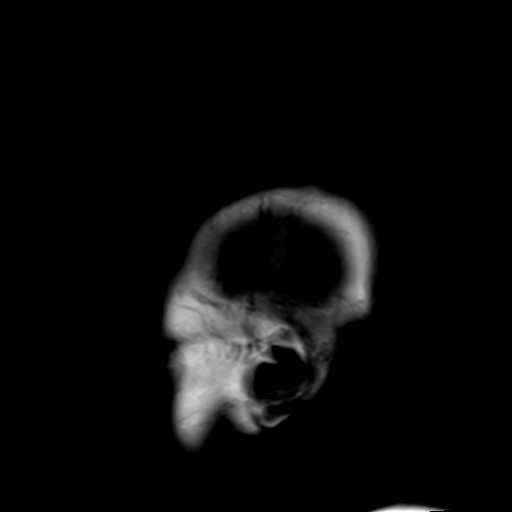

[Series 10: T2 · axial · 5.0mm · 0.86mm/px · z∈[-73,+80]mm · 2 of 27 slices shown (1 of 2)]
[im 1/27]
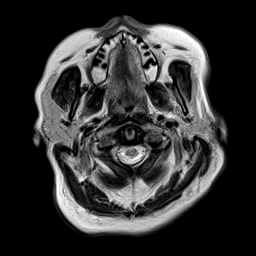
[im 27/27]
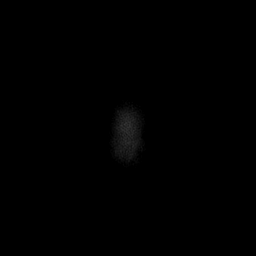

[Series 12: ax swi_pha · axial · 3.0mm · 0.98mm/px · z∈[-74,+87]mm · 4 of 55 slices shown]
[im 1/55]
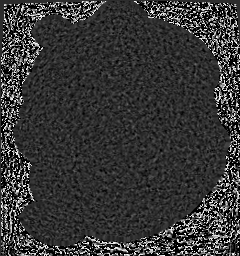
[im 19/55]
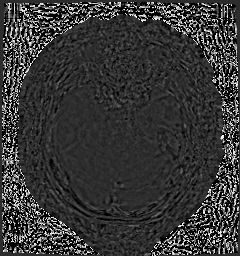
[im 37/55]
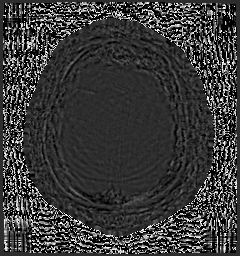
[im 55/55]
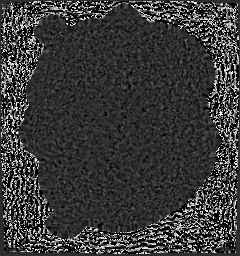

[Series 13: ax swi_swi · axial · 3.0mm · 0.98mm/px · z∈[-74,+87]mm · 5 of 56 slices shown]
[im 1/56]
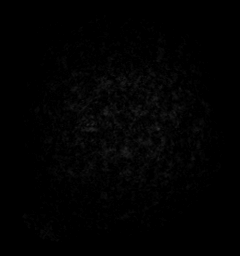
[im 14/56]
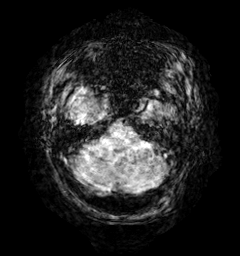
[im 28/56]
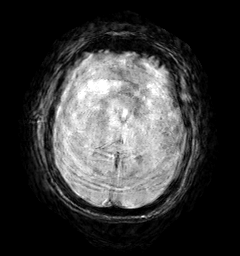
[im 42/56]
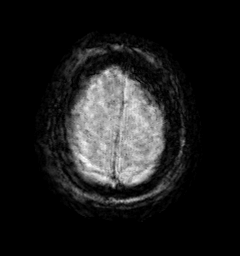
[im 56/56]
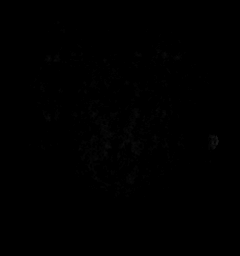

[Series 15: FLAIR · axial · 3.0mm · 0.69mm/px · z∈[-76,+83]mm · 4 of 55 slices shown]
[im 1/55]
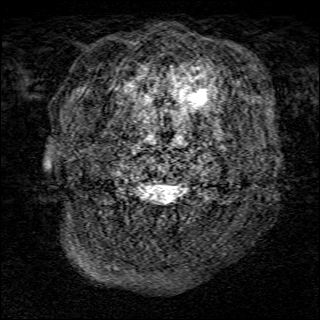
[im 19/55]
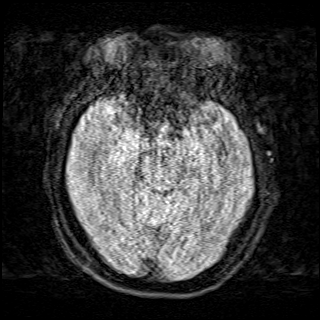
[im 37/55]
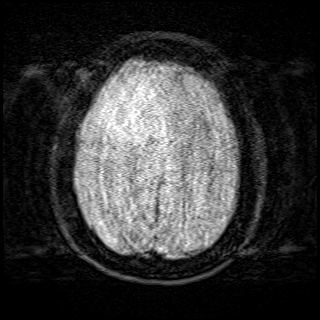
[im 55/55]
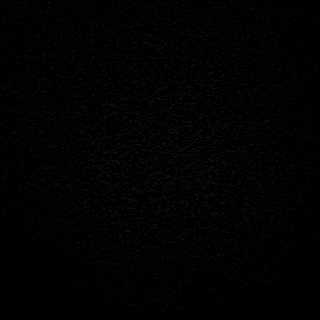

[Series 16: T1 · axial · 1.0mm · 0.98mm/px · z∈[-72,+84]mm · 13 of 160 slices shown (2 of 2)]
[im 1/160]
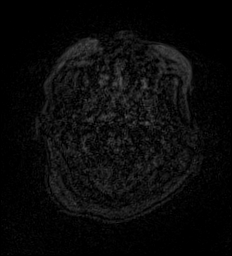
[im 14/160]
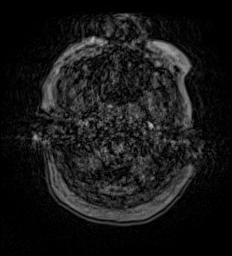
[im 27/160]
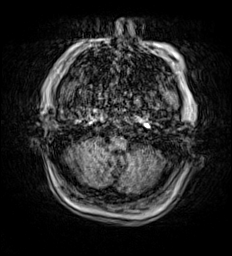
[im 40/160]
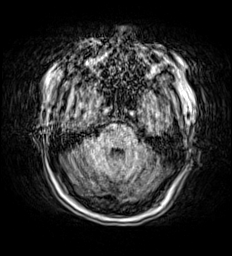
[im 54/160]
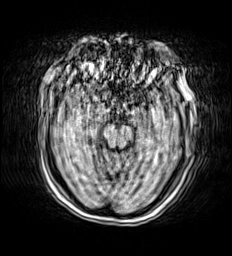
[im 67/160]
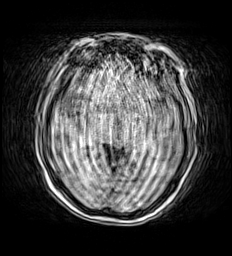
[im 80/160]
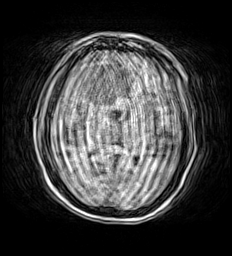
[im 93/160]
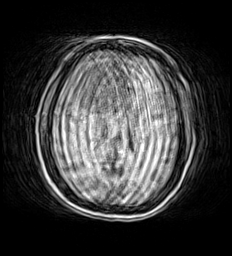
[im 107/160]
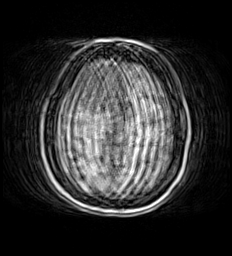
[im 120/160]
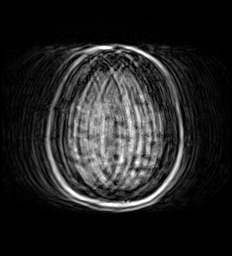
[im 133/160]
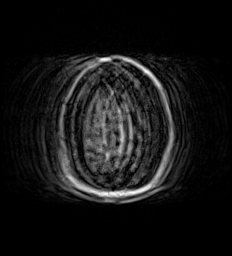
[im 146/160]
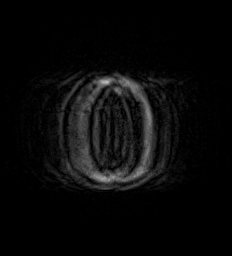
[im 160/160]
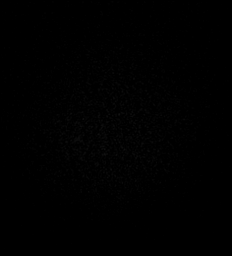

[Series 17: T2 · coronal · 5.0mm · 0.86mm/px · 2 of 28 slices shown (2 of 2)]
[im 1/28]
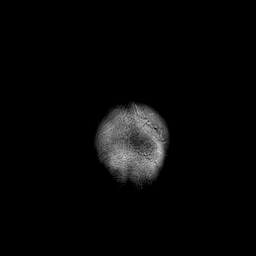
[im 28/28]
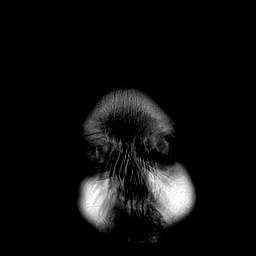

[48 of 48 positions shown; findings below may reference images not displayed]

FINDINGS: The examination is significantly motion degraded, limiting
evaluation. Most notably, there is severe motion degradation of the
sagittal T1 weighted sequence, moderate/severe motion degradation of
the axial T2 TSE sequence, severe motion degradation of the axial T2
FLAIR sequence, severe motion degradation of the axial SWI sequence,
severe motion degradation of the axial T1 weighted sequence and
severe motion degradation of the coronal T2 TSE sequence.

Brain:

No age-advanced or lobar predominant parenchymal atrophy.

Redemonstrated 5.4 x 4.6 x 5.0 cm extra-axial mass along the medial
aspect of the mid-to-anterior right frontal lobe. Prominent mass
effect upon the adjacent right frontal lobe with leftward bowing of
the falx. Effacement of the frontal horn, and partial effacement of
the anterior body, of the right lateral ventricle. Leftward midline
shift focal measures up to 6 mm. No evidence of ventricular
entrapment. Mild edema within the surrounding frontal lobe.
Calcifications within the mass better appreciated on the same-day
head CT.

There is no acute infarct. Within the limitations of motion
degradation, no appreciable chronic intracranial blood products or
extra-axial fluid collection.

Vascular: Maintained flow voids within the proximal large arterial
vessels.

Skull and upper cervical spine: No focal suspicious marrow lesion.

Sinuses/Orbits: No mass or acute finding within the imaged orbits.
No significant paranasal sinus disease.

Other: Trace fluid within the left mastoid air cells.
IMPRESSION: Significantly motion degraded examination, limiting evaluation.

Redemonstrated 5.4 x 4.6 x 5.0 cm extra-axial mass along the medial
aspect of the mid-to-anterior right frontal lobe. This is favored to
reflect a meningioma arising from the falx. Prominent mass effect
upon the adjacent right frontal lobe with leftward bowing of the
falx. Midline shift focally measures up to 6 mm. Effacement of the
frontal horn, and partial effacement of the anterior body, of the
right lateral ventricle. No evidence of ventricular entrapment. Mild
parenchymal edema within the surrounding right frontal lobe. Given
the non-contrast technique and significant motion degradation of the
current exam, a repeat brain MRI without and with contrast is
recommended when the patient is better able to tolerate the study.

The diffusion-weighted imaging is of good quality. No evidence of
acute infarct.

## 2024-04-10 ENCOUNTER — Other Ambulatory Visit: Payer: Self-pay

## 2024-04-10 DIAGNOSIS — M4802 Spinal stenosis, cervical region: Secondary | ICD-10-CM

## 2024-04-10 DIAGNOSIS — G959 Disease of spinal cord, unspecified: Secondary | ICD-10-CM

## 2024-04-10 NOTE — Progress Notes (Signed)
   REFERRING PHYSICIAN:  Mclaughlin, Miriam K, Pa [address] [facility] [place],  [place] [address]  DOS: 01/22/24 ACDF C4-C6 for myelopathy  HISTORY OF PRESENT ILLNESS:  She was doing well at last visit- still with myelopathic symptoms.   Tightness in her neck has improved. She continues with constant numbness in thumb/index on right hand and middle/ring/small on left hand. This is unchanged since surgery. She has no dexterity or balance issues. No tingling. No neck or arm pain. No swallowing issues. Voice is back to normal.   She has an EMG of left upper extremity on 02/25/23 with Dr. Maree that was normal.   PHYSICAL EXAMINATION:  NEUROLOGICAL:  General: In no acute distress.   Awake, alert, oriented to person, place, and time.  Pupils equal round and reactive to light.  Facial tone is symmetric.    Strength: Side Biceps Triceps Deltoid Interossei Grip Wrist Ext. Wrist Flex.  R 5 5 5 5 5 5 5   L 5 5 5 5 5 5 5    Incision well healed  Imaging:  Cervical xrays dated 04/14/24:  No complications noted.  Report not yet available for above xrays. Above xrays reviewed with Dr. Clois.   Assessment / Plan: Stephanie Francis is doing fair s/p above surgery.  She is frustrated that numbness in her hands is not better. Treatment options reviewed with patient and following plan made:   - She can slowly return to activity as tolerated.  - Discussed she may continue to see improvement in numbness over next 6 months. Her balance and dexterity issues are improved.  - Follow up with Dr. Clois in 6 months with repeat xrays.   Reviewed with Dr. Clois after her visit. Can consider bilateral upper extremity EMG/NCS to evaluate for any peripheral cause of numbness in hands. Message sent to patient.   Advised to contact the office if any questions or concerns arise.   Glade Boys PA-C Dept of Neurosurgery

## 2024-04-14 ENCOUNTER — Ambulatory Visit (INDEPENDENT_AMBULATORY_CARE_PROVIDER_SITE_OTHER): Admitting: Orthopedic Surgery

## 2024-04-14 ENCOUNTER — Encounter: Payer: Self-pay | Admitting: Orthopedic Surgery

## 2024-04-14 ENCOUNTER — Ambulatory Visit
Admission: RE | Admit: 2024-04-14 | Discharge: 2024-04-14 | Disposition: A | Discharge status: Home / Self Care | Attending: Orthopedic Surgery | Admitting: Orthopedic Surgery

## 2024-04-14 ENCOUNTER — Ambulatory Visit
Admission: RE | Admit: 2024-04-14 | Discharge: 2024-04-14 | Disposition: A | Discharge status: Home / Self Care | Source: Ambulatory Visit | Attending: Orthopedic Surgery | Admitting: Orthopedic Surgery

## 2024-04-14 VITALS — BP 120/62 | Temp 98.6°F | Ht 65.0 in | Wt 245.2 lb

## 2024-04-14 DIAGNOSIS — M4802 Spinal stenosis, cervical region: Secondary | ICD-10-CM | POA: Insufficient documentation

## 2024-04-14 DIAGNOSIS — K219 Gastro-esophageal reflux disease without esophagitis: Secondary | ICD-10-CM | POA: Insufficient documentation

## 2024-04-14 DIAGNOSIS — F32A Depression, unspecified: Secondary | ICD-10-CM | POA: Insufficient documentation

## 2024-04-14 DIAGNOSIS — R569 Unspecified convulsions: Secondary | ICD-10-CM | POA: Insufficient documentation

## 2024-04-14 DIAGNOSIS — Z981 Arthrodesis status: Secondary | ICD-10-CM

## 2024-04-14 DIAGNOSIS — G959 Disease of spinal cord, unspecified: Secondary | ICD-10-CM | POA: Insufficient documentation

## 2024-04-14 DIAGNOSIS — G4730 Sleep apnea, unspecified: Secondary | ICD-10-CM | POA: Insufficient documentation

## 2024-04-14 DIAGNOSIS — D329 Benign neoplasm of meninges, unspecified: Secondary | ICD-10-CM | POA: Insufficient documentation

## 2024-04-17 ENCOUNTER — Encounter: Payer: Self-pay | Admitting: Neurosurgery

## 2024-08-18 ENCOUNTER — Other Ambulatory Visit: Payer: Self-pay | Admitting: Neurology

## 2024-08-18 ENCOUNTER — Other Ambulatory Visit (HOSPITAL_COMMUNITY): Payer: Self-pay

## 2024-08-18 DIAGNOSIS — D329 Benign neoplasm of meninges, unspecified: Secondary | ICD-10-CM

## 2024-08-21 ENCOUNTER — Ambulatory Visit
Admission: RE | Admit: 2024-08-21 | Discharge: 2024-08-21 | Disposition: A | Source: Ambulatory Visit | Attending: Neurology | Admitting: Neurology

## 2024-08-21 DIAGNOSIS — D329 Benign neoplasm of meninges, unspecified: Secondary | ICD-10-CM | POA: Insufficient documentation

## 2024-08-21 MED ORDER — GADOBUTROL 1 MMOL/ML IV SOLN
10.0000 mL | Freq: Once | INTRAVENOUS | Status: AC | PRN
  Administered 2024-08-21: 10 mL via INTRAVENOUS

## 2024-10-09 ENCOUNTER — Ambulatory Visit: Admission: RE | Admit: 2024-10-09 | Source: Home / Self Care | Admitting: Gastroenterology

## 2024-10-09 ENCOUNTER — Encounter: Admission: RE | Payer: Self-pay | Source: Home / Self Care

## 2024-10-09 SURGERY — COLONOSCOPY
Anesthesia: General

## 2024-10-13 ENCOUNTER — Other Ambulatory Visit: Payer: Self-pay | Admitting: Neurosurgery

## 2024-10-13 DIAGNOSIS — G959 Disease of spinal cord, unspecified: Secondary | ICD-10-CM

## 2024-10-20 ENCOUNTER — Ambulatory Visit: Admitting: Neurosurgery

## 2024-10-20 ENCOUNTER — Encounter: Payer: Self-pay | Admitting: Neurosurgery

## 2024-10-20 ENCOUNTER — Ambulatory Visit

## 2024-10-20 VITALS — BP 118/66 | Temp 99.5°F | Ht 65.0 in | Wt 245.0 lb

## 2024-10-20 DIAGNOSIS — G959 Disease of spinal cord, unspecified: Secondary | ICD-10-CM

## 2024-10-20 DIAGNOSIS — Z981 Arthrodesis status: Secondary | ICD-10-CM | POA: Diagnosis not present

## 2024-10-20 NOTE — Progress Notes (Signed)
" ° °  REFERRING PHYSICIAN:  Mclaughlin, Miriam K, Pa [address] [facility] [place],  [place] [address]  DOS: 01/22/24 ACDF C4-C6 for myelopathy  HISTORY OF PRESENT ILLNESS:  She was doing well at last visit- still with myelopathic symptoms. No neck pain.  PHYSICAL EXAMINATION:  NEUROLOGICAL:  General: In no acute distress.   Awake, alert, oriented to person, place, and time.  Pupils equal round and reactive to light.  Facial tone is symmetric.    Strength: Side Biceps Triceps Deltoid Interossei Grip Wrist Ext. Wrist Flex.  R 5 5 5 5 5 5 5   L 5 5 5 5 5 5 5    Incision well healed  Imaging:  Cervical xrays dated 04/14/24:  No complications noted.  Xrays today are stable  Assessment / Plan: LAM BJORKLUND is doing well s/p above surgery.    She continues to have some numbness in her hands.  This is in her index and thumb on the right and in her 3rd through 5th fingers on the left.  We discussed that this is if they are secondary to a peripheral nerve issue or residual from her myelopathy.  Given the length of time and the prior normal nerve study, I think residual from her myelopathy is more likely.  I will see her back on an as-needed basis.   Reeves Daisy MD Dept of Neurosurgery  "
